# Patient Record
Sex: Female | Born: 1960 | Race: Black or African American | Hispanic: No | Marital: Married | Smoking: Former smoker
Health system: Southern US, Community
[De-identification: ages and names within clinical notes are randomized; demographics above are authoritative.]

## PROBLEM LIST (undated history)

## (undated) DIAGNOSIS — N62 Hypertrophy of breast: Principal | ICD-10-CM

## (undated) DIAGNOSIS — I10 Essential (primary) hypertension: Secondary | ICD-10-CM

## (undated) DIAGNOSIS — E039 Hypothyroidism, unspecified: Secondary | ICD-10-CM

## (undated) DIAGNOSIS — F419 Anxiety disorder, unspecified: Secondary | ICD-10-CM

## (undated) DIAGNOSIS — E079 Disorder of thyroid, unspecified: Secondary | ICD-10-CM

## (undated) DIAGNOSIS — M1990 Unspecified osteoarthritis, unspecified site: Secondary | ICD-10-CM

## (undated) DIAGNOSIS — I209 Angina pectoris, unspecified: Secondary | ICD-10-CM

## (undated) DIAGNOSIS — F32A Depression, unspecified: Secondary | ICD-10-CM

## (undated) HISTORY — PX: APPENDECTOMY: SHX54

## (undated) HISTORY — PX: REDUCTION MAMMAPLASTY: SUR839

## (undated) HISTORY — DX: Unspecified osteoarthritis, unspecified site: M19.90

## (undated) HISTORY — DX: Essential (primary) hypertension: I10

## (undated) HISTORY — DX: Disorder of thyroid, unspecified: E07.9

## (undated) HISTORY — PX: ABDOMINAL HYSTERECTOMY: SHX81

## (undated) HISTORY — DX: Hypertrophy of breast: N62

## (undated) HISTORY — PX: COLON SURGERY: SHX602

## (undated) HISTORY — DX: Anxiety disorder, unspecified: F41.9

## (undated) HISTORY — PX: MEDIAL PARTIAL KNEE REPLACEMENT: SHX5965

---

## 1998-07-08 ENCOUNTER — Other Ambulatory Visit: Admission: RE | Admit: 1998-07-08 | Discharge: 1998-07-08 | Payer: Self-pay | Admitting: *Deleted

## 1999-12-27 ENCOUNTER — Other Ambulatory Visit: Admission: RE | Admit: 1999-12-27 | Discharge: 1999-12-27 | Payer: Self-pay | Admitting: *Deleted

## 2005-10-25 ENCOUNTER — Ambulatory Visit: Payer: Self-pay | Admitting: Gynecology

## 2005-12-14 ENCOUNTER — Ambulatory Visit (HOSPITAL_COMMUNITY): Admission: RE | Admit: 2005-12-14 | Discharge: 2005-12-14 | Payer: Self-pay | Admitting: Gynecology

## 2005-12-14 ENCOUNTER — Ambulatory Visit: Payer: Self-pay | Admitting: Gynecology

## 2005-12-14 ENCOUNTER — Encounter (INDEPENDENT_AMBULATORY_CARE_PROVIDER_SITE_OTHER): Payer: Self-pay | Admitting: *Deleted

## 2006-01-03 ENCOUNTER — Ambulatory Visit: Payer: Self-pay | Admitting: Gynecology

## 2006-06-28 DIAGNOSIS — I10 Essential (primary) hypertension: Secondary | ICD-10-CM | POA: Insufficient documentation

## 2006-10-10 ENCOUNTER — Encounter: Admission: RE | Admit: 2006-10-10 | Discharge: 2006-10-10 | Payer: Self-pay | Admitting: Orthopaedic Surgery

## 2007-04-20 DIAGNOSIS — E890 Postprocedural hypothyroidism: Secondary | ICD-10-CM | POA: Insufficient documentation

## 2007-07-14 ENCOUNTER — Ambulatory Visit: Payer: Self-pay | Admitting: Family Medicine

## 2007-07-14 DIAGNOSIS — F411 Generalized anxiety disorder: Secondary | ICD-10-CM | POA: Insufficient documentation

## 2007-07-14 DIAGNOSIS — M25569 Pain in unspecified knee: Secondary | ICD-10-CM | POA: Insufficient documentation

## 2008-07-17 ENCOUNTER — Ambulatory Visit: Payer: Self-pay | Admitting: Unknown Physician Specialty

## 2008-10-18 ENCOUNTER — Ambulatory Visit: Payer: Self-pay | Admitting: Unknown Physician Specialty

## 2009-06-09 HISTORY — PX: TUBAL LIGATION: SHX77

## 2009-07-21 ENCOUNTER — Ambulatory Visit: Payer: Self-pay | Admitting: Unknown Physician Specialty

## 2009-07-24 ENCOUNTER — Ambulatory Visit: Payer: Self-pay | Admitting: Unknown Physician Specialty

## 2010-03-23 ENCOUNTER — Ambulatory Visit: Payer: Self-pay | Admitting: Unknown Physician Specialty

## 2010-04-02 ENCOUNTER — Ambulatory Visit: Payer: Self-pay | Admitting: Unknown Physician Specialty

## 2010-04-14 LAB — PATHOLOGY REPORT

## 2010-05-05 ENCOUNTER — Ambulatory Visit: Payer: Self-pay | Admitting: Gynecologic Oncology

## 2010-05-07 ENCOUNTER — Ambulatory Visit: Payer: Self-pay | Admitting: Gynecologic Oncology

## 2010-06-07 ENCOUNTER — Ambulatory Visit: Payer: Self-pay | Admitting: Gynecologic Oncology

## 2010-07-07 ENCOUNTER — Ambulatory Visit: Payer: Self-pay | Admitting: Gynecologic Oncology

## 2010-07-24 NOTE — Op Note (Signed)
Kristina Peck, Kristina Peck                 ACCOUNT NO.:  [id number]   MEDICAL RECORD NO.:  [id number]          PATIENT TYPE:  AMB   LOCATION:  SDC                           FACILITY:  [facility]   PHYSICIAN:  Ginger Carne, MD  DATE OF BIRTH:  05-11-1960   DATE OF PROCEDURE:  12/14/2005  DATE OF DISCHARGE:  12/14/2005                                 OPERATIVE REPORT   PREOPERATIVE DIAGNOSIS:  Chronic right lower quadrant pain.   POSTOPERATIVE DIAGNOSIS:  Chronic right lower quadrant pain, chronic  appendicitis and right adnexal adhesions.   PROCEDURE:  Laparoscopic right salpingo-oophorectomy and laparoscopic  appendectomy.   SURGEON:  Ginger Carne, MD   ASSISTANT:  None.   COMPLICATIONS:  None immediate.   ESTIMATED BLOOD LOSS:  Minimal.   SPECIMEN:  Right tube and ovary and appendix to pathology.   ANESTHESIA:  General.   OPERATIVE FINDINGS:  The patient had a previous vaginal hysterectomy with  removal of uterus and cervix.  The left ovary and tube were encased in  rectosigmoid adhesive disease but the patient had no complaints regarding  the left lower quadrant and these were left intact.  The right adnexa was  adherent to the distal bowel and its respective sidewall.  The right ovary  was normal size.  The ureter carefully identified in the right pelvic region  near the takeoff of the infundibulopelvic ligament into the true pelvis.  The appendix demonstrated markedly thickened mesoappendix consistent with  chronic appendicitis.  Large and small bowel grossly normal.  No evidence of  femoral, inguinal or obturator hernias.  Upper abdomen including liver were  normal.   OPERATIVE PROCEDURE:  The patient prepped and draped in the usual fashion  and placed in the lithotomy position.  Betadine solution used for antiseptic  and the patient was catheterized prior to procedure.  After adequate general  anesthesia, vertical infraumbilical incision was made.  The Veress needle  placed in the abdomen.  Opening closing pressures were 10 to 15 mmHg.  Needle released, trocar placed in same incision.  Laparoscope placed in the  trocar sleeve.  Afterwards under direct visualization two 5 mm ports were  made in the left lower quadrant, left hypogastric regions.  The mesoappendix  was identified, bipolar cauterized and cut to the base.  2-0 Vicryl sutures  then placed at the base and then another about 8 mm above the first two  loops.  The appendix was then cut above the first two loops at the base and  removed with an Endopouch bag without difficulty.  Copious irrigation at the  base with lactated Ringer's followed.  No active bleeding noted.  Afterwards, attention was directed to the right adnexa.  Identification of  the ureter in the region of the right tube and ovary and the  infundibulopelvic ligament was pursued.  Afterwards the right  infundibulopelvic ligament was bipolar cauterized and cut, the right tube  and ovary removed with a Endopouch bag without difficulty.  No active  bleeding noted at the stump site.  Copious irrigation with  lactated Ringer's followed.  Afterwards gas released, irrigant removed.  Closure of the 10 mm fascia site with 0 Vicryl suture and 4-0 Vicryl for  subcuticular closure.  The instrument and sponge count were correct.  The  patient tolerated procedure well, returned to the post anesthesia recovery  room in excellent condition.      Ginger Carne, MD  Electronically Signed     SHB/MEDQ  D:  12/14/2005  T:  12/15/2005  Job:  [id number]

## 2010-08-07 ENCOUNTER — Ambulatory Visit: Payer: Self-pay | Admitting: Gynecologic Oncology

## 2010-08-18 ENCOUNTER — Ambulatory Visit: Payer: Self-pay | Admitting: Gynecologic Oncology

## 2010-09-06 ENCOUNTER — Ambulatory Visit: Payer: Self-pay | Admitting: Gynecologic Oncology

## 2010-10-07 ENCOUNTER — Ambulatory Visit: Payer: Self-pay | Admitting: Gynecologic Oncology

## 2010-11-07 ENCOUNTER — Ambulatory Visit: Payer: Self-pay | Admitting: Gynecologic Oncology

## 2011-01-13 ENCOUNTER — Ambulatory Visit: Payer: Self-pay | Admitting: Gynecologic Oncology

## 2011-01-19 ENCOUNTER — Ambulatory Visit: Payer: Self-pay | Admitting: Gynecologic Oncology

## 2011-02-06 ENCOUNTER — Ambulatory Visit: Payer: Self-pay | Admitting: Gynecologic Oncology

## 2011-09-06 DIAGNOSIS — R1900 Intra-abdominal and pelvic swelling, mass and lump, unspecified site: Secondary | ICD-10-CM | POA: Insufficient documentation

## 2011-09-06 DIAGNOSIS — N898 Other specified noninflammatory disorders of vagina: Secondary | ICD-10-CM | POA: Insufficient documentation

## 2011-09-06 HISTORY — DX: Intra-abdominal and pelvic swelling, mass and lump, unspecified site: R19.00

## 2012-05-23 ENCOUNTER — Ambulatory Visit: Payer: Self-pay | Admitting: Family Medicine

## 2012-06-06 ENCOUNTER — Ambulatory Visit: Payer: Self-pay | Admitting: Family Medicine

## 2012-06-29 ENCOUNTER — Ambulatory Visit: Payer: Self-pay | Admitting: Unknown Physician Specialty

## 2012-08-18 DIAGNOSIS — G8929 Other chronic pain: Secondary | ICD-10-CM | POA: Insufficient documentation

## 2012-08-18 DIAGNOSIS — M25561 Pain in right knee: Secondary | ICD-10-CM | POA: Insufficient documentation

## 2013-04-09 ENCOUNTER — Ambulatory Visit: Payer: Self-pay | Admitting: Family Medicine

## 2014-02-04 ENCOUNTER — Ambulatory Visit: Payer: Self-pay | Admitting: Gastroenterology

## 2014-03-05 DIAGNOSIS — Z6841 Body Mass Index (BMI) 40.0 and over, adult: Secondary | ICD-10-CM | POA: Insufficient documentation

## 2014-05-13 HISTORY — PX: KNEE ARTHROSCOPY W/ MENISCAL REPAIR: SHX1877

## 2014-09-04 ENCOUNTER — Encounter: Payer: Self-pay | Admitting: Family Medicine

## 2014-09-04 ENCOUNTER — Ambulatory Visit (INDEPENDENT_AMBULATORY_CARE_PROVIDER_SITE_OTHER): Payer: 59 | Admitting: Family Medicine

## 2014-09-04 VITALS — BP 128/88 | HR 103 | Temp 98.4°F | Resp 18 | Ht 67.0 in | Wt 291.5 lb

## 2014-09-04 DIAGNOSIS — J40 Bronchitis, not specified as acute or chronic: Secondary | ICD-10-CM | POA: Diagnosis not present

## 2014-09-04 DIAGNOSIS — G43009 Migraine without aura, not intractable, without status migrainosus: Secondary | ICD-10-CM | POA: Insufficient documentation

## 2014-09-04 DIAGNOSIS — J209 Acute bronchitis, unspecified: Secondary | ICD-10-CM

## 2014-09-04 MED ORDER — BENZONATATE 200 MG PO CAPS: 200.0000 mg | ORAL_CAPSULE | Freq: Three times a day (TID) | ORAL | Status: DC | PRN

## 2014-09-04 MED ORDER — PROMETHAZINE-CODEINE 6.25-10 MG/5ML PO SYRP: 5.0000 mL | ORAL_SOLUTION | Freq: Three times a day (TID) | ORAL | Status: DC | PRN

## 2014-09-04 MED ORDER — AZITHROMYCIN 500 MG PO TABS: 500.0000 mg | ORAL_TABLET | Freq: Every day | ORAL | Status: DC

## 2014-09-04 NOTE — Progress Notes (Signed)
Name: Kristina Peck   MRN: [id number]    DOB: 1960-10-24   Date:09/04/2014       Progress Note  Subjective  Chief Complaint  Chief Complaint  Patient presents with  . URI    started sunday with a chest cold. patient has taking several otc meds, cough drops and lemon tea.    HPI  Patient is here today with concerns regarding the following symptoms congestion, post nasal drip, ear pressure, sinus pressure, productive cough, achiness and low grade fevers that started days ago.  Associated with fatigue and malaise. Not associated with headaches, rash, nausea vomiting, change in bowel movements. Has no positive sick contacts Has tried the following home remedies: hot teas, left over cough syrup.    Past Medical History  Diagnosis Date  . Anxiety   . Arthritis   . Hypertension   . Thyroid disease    Past Surgical History  Procedure Laterality Date  . Abdominal hysterectomy    . Tubal ligation Bilateral 06/09/2009    Bilateral salphingo-oophorectomy  . Appendectomy    . Colon surgery    . Knee arthroscopy w/ meniscal repair Right 05/13/2014     History  Substance Use Topics  . Smoking status: Never Smoker   . Smokeless tobacco: Not on file  . Alcohol Use: 0.0 oz/week    0 Standard drinks or equivalent per week     Comment: moderately     Current outpatient prescriptions:  .  ARMOUR THYROID 180 MG tablet, Take 1 tablet by mouth daily., Disp: , Rfl:  .  hydrochlorothiazide (HYDRODIURIL) 25 MG tablet, Take 1 tablet by mouth daily., Disp: , Rfl:  .  LORazepam (ATIVAN) 0.5 MG tablet, Take 1 tablet by mouth 2 (two) times daily as needed., Disp: , Rfl:  .  losartan (COZAAR) 100 MG tablet, Take 1 tablet by mouth daily., Disp: , Rfl:  .  sertraline (ZOLOFT) 50 MG tablet, Take 1 tablet by mouth daily., Disp: , Rfl:   Allergies  Allergen Reactions  . Lisinopril Hives    ROS  10 Systems reviewed and is negative except as mentioned in HPI.   Objective  Filed Vitals:   09/04/14  0849  BP: 128/88  Pulse: 103  Temp: 98.4 F (36.9 C)  TempSrc: Oral  Resp: 18  Height: 5\' 7"  (1.702 m)  Weight: 291 lb 8 oz (132.224 kg)  SpO2: 93%   Body mass index is 45.64 kg/(m^2).   Physical Exam  Constitutional: Patient is obese and well-nourished. In no acute distress but does appear to be fatigued from acute illness. HEENT:  - Head: Normocephalic and atraumatic.  - Ears: RIGHT TM bulging with minimal clear exudate, LEFT TM bulging with minimal clear exudate.  - Nose: Nasal mucosa boggy and congested.  - Mouth/Throat: Oropharynx is moist with slight erythema of bilateral tonsils without hypertrophy or exudates. Post nasal drainage present.  - Eyes: Conjunctivae clear, EOM movements normal. PERRLA. No scleral icterus.  Neck: Normal range of motion. Neck supple. No JVD present. No thyromegaly present. No local lymphadenopathy. Cardiovascular: Regular rate, regular rhythm with no murmurs heard.  Pulmonary/Chest: Effort normal and breath sounds clear in all lung fields with some end expiratory rhonchi at anterior upper lung fields bilaterally.  Musculoskeletal: Normal range of motion bilateral UE and LE, no joint effusions. Skin: Skin is warm and dry. No rash noted. Psychiatric: Patient has a normal mood and affect. Behavior is normal in office today. Judgment and thought content normal in  office today.   Assessment & Plan  Etiologies include allergic rhinitis, viral or bacterial infection. Instructed patient on increasing hydration, nasal saline spray, steam inhalation, NSAID if tolerated and not contraindicated. If not already doing so start taking daily anti-histamine and use a steroid nasal spray. If symptoms persist/worsen may consider antibiotic therapy.

## 2014-09-04 NOTE — Patient Instructions (Signed)

## 2014-10-07 ENCOUNTER — Other Ambulatory Visit: Payer: Self-pay | Admitting: Family Medicine

## 2014-10-07 ENCOUNTER — Other Ambulatory Visit: Payer: Self-pay

## 2014-10-07 DIAGNOSIS — F32A Depression, unspecified: Secondary | ICD-10-CM

## 2014-10-07 DIAGNOSIS — I10 Essential (primary) hypertension: Secondary | ICD-10-CM

## 2014-10-07 DIAGNOSIS — F329 Major depressive disorder, single episode, unspecified: Secondary | ICD-10-CM

## 2014-10-07 DIAGNOSIS — F419 Anxiety disorder, unspecified: Principal | ICD-10-CM

## 2014-10-07 MED ORDER — LORAZEPAM 0.5 MG PO TABS: 0.5000 mg | ORAL_TABLET | Freq: Two times a day (BID) | ORAL | Status: DC

## 2014-10-07 NOTE — Telephone Encounter (Signed)
Refill request was sent to Dr. Ashany Sundaram for approval and submission.  

## 2014-12-23 ENCOUNTER — Encounter: Payer: Self-pay | Admitting: Family Medicine

## 2014-12-23 ENCOUNTER — Ambulatory Visit (INDEPENDENT_AMBULATORY_CARE_PROVIDER_SITE_OTHER): Payer: 59 | Admitting: Family Medicine

## 2014-12-23 VITALS — BP 128/76 | HR 109 | Temp 98.5°F | Resp 18 | Wt 286.7 lb

## 2014-12-23 DIAGNOSIS — F411 Generalized anxiety disorder: Secondary | ICD-10-CM | POA: Diagnosis not present

## 2014-12-23 DIAGNOSIS — G8929 Other chronic pain: Secondary | ICD-10-CM

## 2014-12-23 DIAGNOSIS — M25561 Pain in right knee: Secondary | ICD-10-CM

## 2014-12-23 MED ORDER — LORAZEPAM 0.5 MG PO TABS: 0.5000 mg | ORAL_TABLET | Freq: Two times a day (BID) | ORAL | Status: DC

## 2014-12-23 NOTE — Progress Notes (Signed)
Name: Kristina Peck   MRN: [id number]    DOB: 14-Apr-1960   Date:12/23/2014       Progress Note  Subjective  Chief Complaint  Chief Complaint  Patient presents with  . Hypertension    patient is here for a follow-up and to have her appeals form completed.  Marland Kitchen Anxiety    medicaiton management/ adjustment    HPI  Kristina Peck is a 54 year old female here today to have [facility] form filled out as she did not meet their BMI goals. She struggles with weight for many years and reports despite dietary changes she is inconsistent. Exercise activity is limited by chronic arthritis in her right knee. She is working with Orthopedic specialist, could not tolerate Celebrex, using NSAIDs and had intraarticular injections which are not helping much. Trying water aerobics and got a puppy so she can walk more.  Otherwise reports anxiety symptoms are reasonably well controlled. Mostly she gets worked up and worried due to work and often goes home with a lot on her mind. Previously tried increasing Zoloft to 100 mg but it made her too somnolent so she is back to 50 mg a day.  Past Medical History  Diagnosis Date  . Anxiety   . Arthritis   . Hypertension   . Thyroid disease     Patient Active Problem List   Diagnosis Date Noted  . Migraine without aura and without status migrainosus, not intractable 09/04/2014  . Obesity, Class III, BMI 40-49.9 (morbid obesity) ([place]) 03/05/2014  . Chronic pain of right knee 08/18/2012  . Anxiety, generalized 07/14/2007  . Adult hypothyroidism 04/20/2007  . Hypertension goal BP (blood pressure) < 140/90 06/28/2006    Social History  Substance Use Topics  . Smoking status: Never Smoker   . Smokeless tobacco: Not on file  . Alcohol Use: 0.0 oz/week    0 Standard drinks or equivalent per week     Comment: moderately     Current outpatient prescriptions:  .  ARMOUR THYROID 180 MG tablet, Take 1 tablet by mouth daily., Disp: , Rfl:  .  hydrochlorothiazide  (HYDRODIURIL) 25 MG tablet, Take 1 tablet by mouth  daily, Disp: 90 tablet, Rfl: 2 .  LORazepam (ATIVAN) 0.5 MG tablet, Take 1 tablet (0.5 mg total) by mouth 2 (two) times daily., Disp: 180 tablet, Rfl: 1 .  losartan (COZAAR) 100 MG tablet, Take 1 tablet by mouth daily., Disp: , Rfl:  .  sertraline (ZOLOFT) 50 MG tablet, Take 1 tablet by mouth daily., Disp: , Rfl:   Past Surgical History  Procedure Laterality Date  . Abdominal hysterectomy    . Tubal ligation Bilateral 06/09/2009    Bilateral salphingo-oophorectomy  . Appendectomy    . Colon surgery    . Knee arthroscopy w/ meniscal repair Right 05/13/2014    Family History  Problem Relation Age of Onset  . Diabetes Mother   . Hypertension Mother     Allergies  Allergen Reactions  . Lisinopril Hives     Review of Systems  CONSTITUTIONAL: No significant weight changes, fever, chills, weakness or fatigue.  SKIN: No rash or itching.  CARDIOVASCULAR: No chest pain, chest pressure or chest discomfort. No palpitations or edema.  RESPIRATORY: No shortness of breath, cough or sputum.  NEUROLOGICAL: No headache, dizziness, syncope, paralysis, ataxia, numbness or tingling in the extremities. No memory changes. No change in bowel or bladder control.  MUSCULOSKELETAL: No joint pain. No muscle pain. HEMATOLOGIC: No anemia, bleeding or bruising.  LYMPHATICS: No enlarged lymph nodes.  PSYCHIATRIC: No change in mood. No change in sleep pattern.  ENDOCRINOLOGIC: No reports of sweating, cold or heat intolerance. No polyuria or polydipsia.     Objective  BP 128/76 mmHg  Pulse 109  Temp(Src) 98.5 F (36.9 C) (Oral)  Resp 18  Wt 286 lb 11.2 oz (130.046 kg)  SpO2 97% Body mass index is 44.89 kg/(m^2).  Physical Exam  Constitutional: Patient is obese and well-nourished. In no distress.  Cardiovascular: Normal rate, regular rhythm and normal heart sounds.  No murmur heard.  Pulmonary/Chest: Effort normal and breath sounds normal. No  respiratory distress. Musculoskeletal: Normal range of motion bilateral UE and LE, no joint effusions. Peripheral vascular: Bilateral LE no edema. Neurological: CN II-XII grossly intact with no focal deficits. Alert and oriented to person, place, and time. Coordination, balance, strength, speech and gait are normal.  Skin: Skin is warm and dry. No rash noted. No erythema.  Psychiatric: Patient has a normal mood and affect. Behavior is normal in office today. Judgment and thought content normal in office today.   Assessment & Plan  1. GAD (generalized anxiety disorder) Instructed patient to try 1.5 tablets of Zoloft 50 mg dose and call me in 1 week.  - LORazepam (ATIVAN) 0.5 MG tablet; Take 1 tablet (0.5 mg total) by mouth 2 (two) times daily.  Dispense: 180 tablet; Refill: 1  2. Chronic pain of right knee Follow up with Orthopedist.  3. Obesity, Class III, BMI 40-49.9 (morbid obesity) ([place]) [facility] Form filled out. She has lost about 5 lbs since her last visit.   The patient has been counseled on their higher than normal BMI.  They have verbally expressed understanding their increased risk for other diseases.  In efforts to meet a better target BMI goal the patient has been counseled on lifestyle, diet and exercise modification tactics. Start with moderate intensity aerobic exercise (walking, jogging, elliptical, swimming, group or individual sports, hiking) at least 46mins a day at least 4 days a week and increase intensity, duration, frequency as tolerated. Diet should include well balance fresh fruits and vegetables avoiding processed foods, carbohydrates and sugars. Drink at least 8oz 10 glasses a day avoiding sodas, sugary fruit drinks, sweetened tea. Check weight on a reliable scale daily and monitor weight loss progress daily. Consider investing in mobile phone apps that will help keep track of weight loss goals.

## 2015-02-24 ENCOUNTER — Other Ambulatory Visit: Payer: Self-pay | Admitting: Family Medicine

## 2015-03-18 ENCOUNTER — Other Ambulatory Visit: Payer: Self-pay

## 2015-03-18 DIAGNOSIS — F411 Generalized anxiety disorder: Secondary | ICD-10-CM

## 2015-03-21 ENCOUNTER — Other Ambulatory Visit: Payer: Self-pay

## 2015-03-21 DIAGNOSIS — F411 Generalized anxiety disorder: Secondary | ICD-10-CM

## 2015-03-25 MED ORDER — LORAZEPAM 0.5 MG PO TABS: 0.5000 mg | ORAL_TABLET | Freq: Two times a day (BID) | ORAL | Status: DC

## 2015-05-06 ENCOUNTER — Other Ambulatory Visit: Payer: Self-pay | Admitting: Family Medicine

## 2015-06-05 ENCOUNTER — Ambulatory Visit: Payer: 59 | Admitting: Family Medicine

## 2015-07-10 HISTORY — PX: KNEE ARTHROSCOPY: SUR90

## 2015-07-28 ENCOUNTER — Other Ambulatory Visit: Payer: Self-pay

## 2015-07-28 DIAGNOSIS — Z5181 Encounter for therapeutic drug level monitoring: Secondary | ICD-10-CM

## 2015-07-28 DIAGNOSIS — E038 Other specified hypothyroidism: Secondary | ICD-10-CM

## 2015-07-29 DIAGNOSIS — Z5181 Encounter for therapeutic drug level monitoring: Secondary | ICD-10-CM | POA: Insufficient documentation

## 2015-07-29 NOTE — Assessment & Plan Note (Signed)
>>  ASSESSMENT AND PLAN FOR ADULT HYPOTHYROIDISM WRITTEN ON 07/29/2015  4:37 PM BY LADA, Satira Anis, MD  Needs labs; last TSH Jan 2016

## 2015-07-29 NOTE — Telephone Encounter (Signed)
Last TSH was actually Jan 2016; please ask patient to have labs done tomorrow or Wednesday She should not run out of medicine until we get the labs back if she'll go tomorrow or the next day Need to make sure dose doesn't need to be adjusted Thank you

## 2015-07-29 NOTE — Assessment & Plan Note (Signed)
Needs labs; last TSH Jan 2016

## 2015-07-30 MED ORDER — ARMOUR THYROID 180 MG PO TABS: ORAL_TABLET | ORAL | Status: DC

## 2015-07-30 NOTE — Telephone Encounter (Signed)
I approved one month, but please urge her to go just as soon as she can; don't wait a month; please do this week if possible; too much thyroid medicine can lead to osteoporosis, fractures, dangerous heart arrhythmias, and heart failure so this really needs to be monitored; I won't be able to continue the 180 mg strength beyond that without labs; thank you; we're just trying to be safe and take good care of her

## 2015-07-30 NOTE — Telephone Encounter (Signed)
Pt states just had  Knee replacement and can not get around good.  She wants to know if you can refill for 1 month until she can go get bloodwork?

## 2015-09-22 ENCOUNTER — Other Ambulatory Visit: Payer: Self-pay | Admitting: Family Medicine

## 2015-09-22 ENCOUNTER — Encounter: Payer: Self-pay | Admitting: Family Medicine

## 2015-09-23 LAB — BASIC METABOLIC PANEL
BUN/Creatinine Ratio: 12 (ref 9–23)
BUN: 10 mg/dL (ref 6–24)
CALCIUM: 9.3 mg/dL (ref 8.7–10.2)
CHLORIDE: 96 mmol/L (ref 96–106)
CO2: 27 mmol/L (ref 18–29)
CREATININE: 0.86 mg/dL (ref 0.57–1.00)
GFR calc Af Amer: 89 mL/min/{1.73_m2} (ref >59)
GFR calc non Af Amer: 77 mL/min/{1.73_m2} (ref >59)
GLUCOSE: 94 mg/dL (ref 65–99)
Potassium: 4 mmol/L (ref 3.5–5.2)
Sodium: 139 mmol/L (ref 134–144)

## 2015-09-23 LAB — T4, FREE: FREE T4: 0.79 ng/dL — AB (ref 0.82–1.77)

## 2015-09-23 LAB — T3, FREE: T3 FREE: 3.7 pg/mL (ref 2.0–4.4)

## 2015-09-23 LAB — TSH: TSH: 14.45 u[IU]/mL — AB (ref 0.450–4.500)

## 2015-09-24 ENCOUNTER — Telehealth: Payer: Self-pay | Admitting: Family Medicine

## 2015-09-24 NOTE — Telephone Encounter (Signed)
Abnormal thyroid tests; need to adjust medicine

## 2015-09-26 ENCOUNTER — Ambulatory Visit (INDEPENDENT_AMBULATORY_CARE_PROVIDER_SITE_OTHER): Payer: 59 | Admitting: Family Medicine

## 2015-09-26 ENCOUNTER — Encounter: Payer: Self-pay | Admitting: Family Medicine

## 2015-09-26 VITALS — BP 132/86 | HR 98 | Temp 99.4°F | Resp 14 | Wt 256.0 lb

## 2015-09-26 DIAGNOSIS — Z1231 Encounter for screening mammogram for malignant neoplasm of breast: Secondary | ICD-10-CM

## 2015-09-26 DIAGNOSIS — Z114 Encounter for screening for human immunodeficiency virus [HIV]: Secondary | ICD-10-CM

## 2015-09-26 DIAGNOSIS — G4700 Insomnia, unspecified: Secondary | ICD-10-CM | POA: Diagnosis not present

## 2015-09-26 DIAGNOSIS — Z1159 Encounter for screening for other viral diseases: Secondary | ICD-10-CM

## 2015-09-26 DIAGNOSIS — F411 Generalized anxiety disorder: Secondary | ICD-10-CM

## 2015-09-26 DIAGNOSIS — I10 Essential (primary) hypertension: Secondary | ICD-10-CM | POA: Diagnosis not present

## 2015-09-26 DIAGNOSIS — E038 Other specified hypothyroidism: Secondary | ICD-10-CM

## 2015-09-26 HISTORY — DX: Encounter for screening mammogram for malignant neoplasm of breast: Z12.31

## 2015-09-26 MED ORDER — TRAZODONE HCL 50 MG PO TABS
25.0000 mg | ORAL_TABLET | Freq: Every evening | ORAL | Status: DC | PRN
Start: 2015-09-26 — End: 2015-10-31

## 2015-09-26 MED ORDER — ESCITALOPRAM OXALATE 10 MG PO TABS: 10.0000 mg | ORAL_TABLET | Freq: Every day | ORAL | Status: DC

## 2015-09-26 MED ORDER — ARMOUR THYROID 180 MG PO TABS: ORAL_TABLET | ORAL | Status: DC

## 2015-09-26 MED ORDER — LOSARTAN POTASSIUM 100 MG PO TABS: 100.0000 mg | ORAL_TABLET | Freq: Every day | ORAL | Status: DC

## 2015-09-26 MED ORDER — HYDROCHLOROTHIAZIDE 25 MG PO TABS: 25.0000 mg | ORAL_TABLET | Freq: Every day | ORAL | Status: DC

## 2015-09-26 NOTE — Assessment & Plan Note (Signed)
Had goiter, then RAIU, was hyper, and now hypo; refer back to endo for stabilization

## 2015-09-26 NOTE — Patient Instructions (Addendum)
Please do call to schedule your mammogram; the number to schedule one at either [facility] or [facility] is [phone]  Stop the sertraline Start the escitalopram Use the trazodone at bedtime if needed for sleep  Have labs done today or soon If you have not heard anything from my staff in a week about any orders/referrals/studies from today, please contact us here to follow-up [phone]  We'll refer you to the endocrinologist  Check out the information at familydoctor.org entitled "Nutrition for Weight Loss: What You Need to Know about Fad Diets" Try to lose between 1-2 pounds per week by taking in fewer calories and burning off more calories You can succeed by limiting portions, limiting foods dense in calories and fat, becoming more active, and drinking 8 glasses of water a day (64 ounces) Don't skip meals, especially breakfast, as skipping meals may alter your metabolism Do not use over-the-counter weight loss pills or gimmicks that claim rapid weight loss A healthy BMI (or body mass index) is between 18.5 and 24.9 You can calculate your ideal BMI at the Industry website ClubMonetize.fr  Your goal blood pressure is less than 140 mmHg on top. Try to follow the DASH guidelines (DASH stands for Dietary Approaches to Stop Hypertension) Try to limit the sodium in your diet.  Ideally, consume less than 1.5 grams (less than 1,500mg ) per day. Do not add salt when cooking or at the table.  Check the sodium amount on labels when shopping, and choose items lower in sodium when given a choice. Avoid or limit foods that already contain a lot of sodium. Eat a diet rich in fruits and vegetables and whole grains.  DASH Eating Plan DASH stands for "Dietary Approaches to Stop Hypertension." The DASH eating plan is a healthy eating plan that has been shown to reduce high blood pressure (hypertension). Additional health  benefits may include reducing the risk of type 2 diabetes mellitus, heart disease, and stroke. The DASH eating plan may also help with weight loss. WHAT DO I NEED TO KNOW ABOUT THE DASH EATING PLAN? For the DASH eating plan, you will follow these general guidelines:  Choose foods with a percent daily value for sodium of less than 5% (as listed on the food label).  Use salt-free seasonings or herbs instead of table salt or sea salt.  Check with your health care provider or pharmacist before using salt substitutes.  Eat lower-sodium products, often labeled as "lower sodium" or "no salt added."  Eat fresh foods.  Eat more vegetables, fruits, and low-fat dairy products.  Choose whole grains. Look for the word "whole" as the first word in the ingredient list.  Choose fish and skinless chicken or [place] more often than red meat. Limit fish, poultry, and meat to 6 oz (170 g) each day.  Limit sweets, desserts, sugars, and sugary drinks.  Choose heart-healthy fats.  Limit cheese to 1 oz (28 g) per day.  Eat more home-cooked food and less restaurant, buffet, and fast food.  Limit fried foods.  Cook foods using methods other than frying.  Limit canned vegetables. If you do use them, rinse them well to decrease the sodium.  When eating at a restaurant, ask that your food be prepared with less salt, or no salt if possible. WHAT FOODS CAN I EAT? Seek help from a dietitian for individual calorie needs. Grains Whole grain or whole wheat bread. Brown rice. Whole grain or whole wheat pasta. Quinoa, bulgur, and whole grain  cereals. Low-sodium cereals. Corn or whole wheat flour tortillas. Whole grain cornbread. Whole grain crackers. Low-sodium crackers. Vegetables Fresh or frozen vegetables (raw, steamed, roasted, or grilled). Low-sodium or reduced-sodium tomato and vegetable juices. Low-sodium or reduced-sodium tomato sauce and paste. Low-sodium or reduced-sodium canned vegetables.  Fruits All  fresh, canned (in natural juice), or frozen fruits. Meat and Other Protein Products Ground beef (85% or leaner), grass-fed beef, or beef trimmed of fat. Skinless chicken or [place]. Ground chicken or [place]. Pork trimmed of fat. All fish and seafood. Eggs. Dried beans, peas, or lentils. Unsalted nuts and seeds. Unsalted canned beans. Dairy Low-fat dairy products, such as skim or 1% milk, 2% or reduced-fat cheeses, low-fat ricotta or cottage cheese, or plain low-fat yogurt. Low-sodium or reduced-sodium cheeses. Fats and Oils Tub margarines without trans fats. Light or reduced-fat mayonnaise and salad dressings (reduced sodium). Avocado. Safflower, olive, or canola oils. Natural peanut or almond butter. Other Unsalted popcorn and pretzels. The items listed above may not be a complete list of recommended foods or beverages. Contact your dietitian for more options. WHAT FOODS ARE NOT RECOMMENDED? Grains White bread. White pasta. White rice. Refined cornbread. Bagels and croissants. Crackers that contain trans fat. Vegetables Creamed or fried vegetables. Vegetables in a cheese sauce. Regular canned vegetables. Regular canned tomato sauce and paste. Regular tomato and vegetable juices. Fruits Dried fruits. Canned fruit in light or heavy syrup. Fruit juice. Meat and Other Protein Products Fatty cuts of meat. Ribs, chicken wings, bacon, sausage, bologna, salami, chitterlings, fatback, hot dogs, bratwurst, and packaged luncheon meats. Salted nuts and seeds. Canned beans with salt. Dairy Whole or 2% milk, cream, half-and-half, and cream cheese. Whole-fat or sweetened yogurt. Full-fat cheeses or blue cheese. Nondairy creamers and whipped toppings. Processed cheese, cheese spreads, or cheese curds. Condiments Onion and garlic salt, seasoned salt, table salt, and sea salt. Canned and packaged gravies. Worcestershire sauce. Tartar sauce. Barbecue sauce. Teriyaki sauce. Soy sauce, including reduced sodium.  Steak sauce. Fish sauce. Oyster sauce. Cocktail sauce. Horseradish. Ketchup and mustard. Meat flavorings and tenderizers. Bouillon cubes. Hot sauce. Tabasco sauce. Marinades. Taco seasonings. Relishes. Fats and Oils Butter, stick margarine, lard, shortening, ghee, and bacon fat. Coconut, palm kernel, or palm oils. Regular salad dressings. Other Pickles and olives. Salted popcorn and pretzels. The items listed above may not be a complete list of foods and beverages to avoid. Contact your dietitian for more information. WHERE CAN I FIND MORE INFORMATION? National Heart, Lung, and Blood Institute: travelstabloid.com   This information is not intended to replace advice given to you by your health care provider. Make sure you discuss any questions you have with your health care provider.   Document Released: 02/11/2011 Document Revised: 03/15/2014 Document Reviewed: 12/27/2012 Elsevier Interactive Patient Education [employer].

## 2015-09-26 NOTE — Progress Notes (Signed)
BP 132/86   Pulse 98   Temp 99.4 F (37.4 C) (Oral)   Resp 14   Wt 256 lb (116.1 kg)   SpO2 94%   BMI 40.10 kg/m     Subjective:    Patient ID: Kristina Peck, female    DOB: April 08, 1960, 55 y.o.   MRN: [id number]  HPI: Kristina Peck is a 55 y.o. female  Chief Complaint  Patient presents with  . Follow-up  . Anxiety   She got her weight down to get the surgery; she lost 35 pounds over the last year; she went to a weight loss clinic, 6 week diet plan, started in February; she cut out dairy, no oils, no sugars, organic sugar, just eating eggs in the morning and 40 calorie bread slice; salads are "free", certain dressings; chicken for lunch; veggies with every meal; did that for 6 weeks and then went into maintenance; never went back to sugar or soft drinks; drinks unsweetened tea;  She works for [employer] and has to meet their BMI; now that she's had her knee replacement, she can be more active  She is having more anxiety; just started back to work this week; works on [employer], academic and she enjoys it but went back to work; causes her anxiety, lots of stress; she felt like she could just break out crying and trying to hold it together; she takes lorazepam, takes it only at night; not during the day;   She has hypothyroidism; has not been taking medicine faithfully; misses 1-2 days a week on average; she does not get palpitations or chest pain; never could take synthroid, never regulated things, gained weight; does not want to take it; she used to go to Dr. Ronnald Collum, then once she got regulated  Depression screen [facility] 2/9 09/26/2015 12/23/2014 09/04/2014  Decreased Interest 0 0 0  Down, Depressed, Hopeless 1 0 0  PHQ - 2 Score 1 0 0   Relevant past medical, surgical, family and social history reviewed Past Medical History:  Diagnosis Date  . Anxiety   . Arthritis   . Hypertension   . Thyroid disease    Past Surgical History:  Procedure Laterality Date  . ABDOMINAL HYSTERECTOMY    .  APPENDECTOMY    . COLON SURGERY    . KNEE ARTHROSCOPY Right 07/10/2015  . KNEE ARTHROSCOPY W/ MENISCAL REPAIR Right 05/13/2014  . MEDIAL PARTIAL KNEE REPLACEMENT Right [id number]  . TUBAL LIGATION Bilateral 06/09/2009   Bilateral salphingo-oophorectomy   Family History  Problem Relation Age of Onset  . Diabetes Mother   . Hypertension Mother    Social History  Substance Use Topics  . Smoking status: Former Research scientist (life sciences)  . Smokeless tobacco: Not on file  . Alcohol use 0.0 oz/week     Comment: moderately   Interim medical history since last visit reviewed. Allergies and medications reviewed  Review of Systems Per HPI unless specifically indicated above     Objective:    BP 132/86   Pulse 98   Temp 99.4 F (37.4 C) (Oral)   Resp 14   Wt 256 lb (116.1 kg)   SpO2 94%   BMI 40.10 kg/m    Wt Readings from Last 3 Encounters:  10/31/15 253 lb 12.8 oz (115.1 kg)  09/26/15 256 lb (116.1 kg)  12/23/14 286 lb 11.2 oz (130 kg)    Physical Exam  Constitutional: She appears well-developed and well-nourished. No distress.  HENT:  Head: Normocephalic and atraumatic.  Eyes: EOM  are normal. No scleral icterus.  Neck: No thyromegaly present.  Cardiovascular: Normal rate, regular rhythm and normal heart sounds.   Pulmonary/Chest: Effort normal and breath sounds normal.  Abdominal: Soft. She exhibits no distension.  Musculoskeletal: She exhibits no edema.  Neurological: She is alert.  Skin: Skin is warm and dry. No pallor.  Psychiatric: She has a normal mood and affect. Her behavior is normal. Judgment and thought content normal.      Assessment & Plan:   Problem List Items Addressed This Visit      Cardiovascular and Mediastinum   Hypertension goal BP (blood pressure) < 140/90    Fair control; weight loss, DASH guidelines; meds      Relevant Medications   losartan (COZAAR) 100 MG tablet   hydrochlorothiazide (HYDRODIURIL) 25 MG tablet     Endocrine   Adult hypothyroidism -  Primary    Had goiter, then RAIU, was hyper, and now hypo; refer back to endo for stabilization      Relevant Medications   ARMOUR THYROID 180 MG tablet   Other Relevant Orders   Ambulatory referral to Endocrinology     Other   Screening for HIV (human immunodeficiency virus)   Relevant Orders   HIV antibody   Need for hepatitis C screening test   Relevant Orders   Hepatitis C Antibody   Insomnia    Will use trazodone for sleep if needed; also encouraged non-pharm means for sleep, sleep hygiene encouraged      Encounter for screening mammogram for breast cancer   Relevant Orders   MM DIGITAL SCREENING BILATERAL   Anxiety, generalized    Change SSRIs; relaxation, dealing with stress with non-pharm means important too       Other Visit Diagnoses    Hypertension, goal below 140/90       Relevant Medications   losartan (COZAAR) 100 MG tablet   hydrochlorothiazide (HYDRODIURIL) 25 MG tablet      Follow up plan: Return in about 4 weeks (around 10/24/2015) for medication follow-up.  An after-visit summary was printed and given to the patient at [place].  Please see the patient instructions which may contain other information and recommendations beyond what is mentioned above in the assessment and plan.  Meds ordered this encounter  Medications  . DISCONTD: escitalopram (LEXAPRO) 10 MG tablet    Sig: Take 1 tablet (10 mg total) by mouth daily.    Dispense:  30 tablet    Refill:  0  . DISCONTD: traZODone (DESYREL) 50 MG tablet    Sig: Take 0.5-1 tablets (25-50 mg total) by mouth at bedtime as needed for sleep.    Dispense:  30 tablet    Refill:  3  . ARMOUR THYROID 180 MG tablet    Sig: Take 1 tablet by mouth every morning    Dispense:  90 tablet    Refill:  0  . losartan (COZAAR) 100 MG tablet    Sig: Take 1 tablet (100 mg total) by mouth daily.    Dispense:  90 tablet    Refill:  1  . hydrochlorothiazide (HYDRODIURIL) 25 MG tablet    Sig: Take 1 tablet (25 mg  total) by mouth daily.    Dispense:  90 tablet    Refill:  3   Orders Placed This Encounter  Procedures  . MM DIGITAL SCREENING BILATERAL  . HIV antibody  . Hepatitis C Antibody  . Ambulatory referral to Endocrinology

## 2015-09-26 NOTE — Telephone Encounter (Signed)
I saw patient today; she admits to poor compliance with her medicine; she'll see Dr. Ronnald Collum to get this regulated

## 2015-09-26 NOTE — Assessment & Plan Note (Signed)
>>  ASSESSMENT AND PLAN FOR ADULT HYPOTHYROIDISM WRITTEN ON 09/26/2015  4:35 PM BY LADA, MELINDA P, MD  Had goiter, then RAIU, was hyper, and now hypo; refer back to endo for stabilization

## 2015-10-31 ENCOUNTER — Encounter: Payer: Self-pay | Admitting: Family Medicine

## 2015-10-31 ENCOUNTER — Ambulatory Visit (INDEPENDENT_AMBULATORY_CARE_PROVIDER_SITE_OTHER): Payer: 59 | Admitting: Family Medicine

## 2015-10-31 DIAGNOSIS — E038 Other specified hypothyroidism: Secondary | ICD-10-CM | POA: Diagnosis not present

## 2015-10-31 DIAGNOSIS — F411 Generalized anxiety disorder: Secondary | ICD-10-CM | POA: Diagnosis not present

## 2015-10-31 DIAGNOSIS — Z1159 Encounter for screening for other viral diseases: Secondary | ICD-10-CM

## 2015-10-31 DIAGNOSIS — G4700 Insomnia, unspecified: Secondary | ICD-10-CM | POA: Diagnosis not present

## 2015-10-31 DIAGNOSIS — Z114 Encounter for screening for human immunodeficiency virus [HIV]: Secondary | ICD-10-CM | POA: Diagnosis not present

## 2015-10-31 MED ORDER — SUVOREXANT 5 MG PO TABS: 5.0000 mg | ORAL_TABLET | Freq: Every evening | ORAL | 2 refills | Status: DC | PRN

## 2015-10-31 MED ORDER — ESCITALOPRAM OXALATE 10 MG PO TABS: 15.0000 mg | ORAL_TABLET | Freq: Every day | ORAL | 1 refills | Status: DC

## 2015-10-31 NOTE — Assessment & Plan Note (Signed)
Discussed one-time HIV screening recommendation per USPSTF guidelines; patient agrees with testing; HIV antibody ordered 

## 2015-10-31 NOTE — Patient Instructions (Addendum)
Increase escitalopram to 15 mg daily Practice mindfulness Try the Belsomra for sleep if needed  12 Ways to Curb Anxiety  ?Anxiety is normal human sensation. It is what helped our ancestors survive the pitfalls of the wilderness. Anxiety is defined as experiencing worry or nervousness about an imminent event or something with an uncertain outcome. It is a feeling experienced by most people at some point in their lives. Anxiety can be triggered by a very personal issue, such as the illness of a loved one, or an event of global proportions, such as a refugee crisis. Some of the symptoms of anxiety are:  Feeling restless.  Having a feeling of impending danger.  Increased heart rate.  Rapid breathing. Sweating.  Shaking.  Weakness or feeling tired.  Difficulty concentrating on anything except the current worry.  Insomnia.  Stomach or bowel problems. What can we do about anxiety we may be feeling? There are many techniques to help manage stress and relax. Here are 12 ways you can reduce your anxiety almost immediately: 1. Turn off the constant feed of information. Take a social media sabbatical. Studies have shown that social media directly contributes to social anxiety.  2. Monitor your television viewing habits. Are you watching shows that are also contributing to your anxiety, such as 24-hour news stations? Try watching something else, or better yet, nothing at all. Instead, listen to music, read an inspirational book or practice a hobby. 3. Eat nutritious meals. Also, don't skip meals and keep healthful snacks on hand. Hunger and poor diet contributes to feeling anxious. 4. Sleep. Sleeping on a regular schedule for at least seven to eight hours a night will do wonders for your outlook when you are awake. 5. Exercise. Regular exercise will help rid your body of that anxious energy and help you get more restful sleep. 6. Try deep (diaphragmatic) breathing. Inhale slowly through your nose for five  seconds and exhale through your mouth. 7. Practice acceptance and gratitude. When anxiety hits, accept that there are things out of your control that shouldn't be of immediate concern.  8. Seek out humor. When anxiety strikes, watch a funny video, read jokes or call a friend who makes you laugh. Laughter is healing for our bodies and releases endorphins that are calming. 9. Stay positive. Take the effort to replace negative thoughts with positive ones. Try to see a stressful situation in a positive light. Try to come up with solutions rather than dwelling on the problem. 10. Figure out what triggers your anxiety. Keep a journal and make note of anxious moments and the events surrounding them. This will help you identify triggers you can avoid or even eliminate. 11. Talk to someone. Let a trusted friend, family member or even trained professional know that you are feeling overwhelmed and anxious. Verbalize what you are feeling and why.  12. Volunteer. If your anxiety is triggered by a crisis on a large scale, become an advocate and work to resolve the problem that is causing you unease. Anxiety is often unwelcome and can become overwhelming. If not kept in check, it can become a disorder that could require medical treatment. However, if you take the time to care for yourself and avoid the triggers that make you anxious, you will be able to find moments of relaxation and clarity that make your life much more enjoyable.   Steps to Elicit the Relaxation Response The following is the technique reprinted with permission from Dr. Billie Ruddy book The Relaxation Response  pages 162-163 1. Sit quietly in a comfortable position. 2. Close your eyes. 3. Deeply relax all your muscles,  beginning at your feet and progressing up to your face.  Keep them relaxed. 4. Breathe through your nose.  Become aware of your breathing.  As you breathe out, say the word, "one"*,  silently to yourself. For example,   breathe in ... out, "one",- in .. out, "one", etc.  Breathe easily and naturally. 5. Continue for 10 to 20 minutes.  You may open your eyes to check the time, but do not use an alarm.  When you finish, sit quietly for several minutes,  at first with your eyes closed and later with your eyes opened.  Do not stand up for a few minutes. 6. Do not worry about whether you are successful  in achieving a deep level of relaxation.  Maintain a passive attitude and permit relaxation to occur at its own pace.  When distracting thoughts occur,  try to ignore them by not dwelling upon them  and return to repeating "one."  With practice, the response should come with little effort.  Practice the technique once or twice daily,  but not within two hours after any meal,  since the digestive processes seem to interfere with  the elicitation of the Relaxation Response. * It is better to use a soothing, mellifluous sound, preferably with no meaning. or association, to avoid stimulation of unnecessary thoughts - a mantra.

## 2015-10-31 NOTE — Progress Notes (Signed)
BP 128/66   Pulse 95   Temp 98.3 F (36.8 C) (Oral)   Resp 14   Wt 253 lb 12.8 oz (115.1 kg)   SpO2 97%   BMI 39.75 kg/m    Subjective:    Patient ID: Kristina Peck, female    DOB: September 30, 1960, 55 y.o.   MRN: [id number]  HPI: Kristina Peck is a 55 y.o. female  Chief Complaint  Patient presents with  . Follow-up    4 weeks   Patient is here for follow-up Her medicine was switched; her anxiety medicine was switched and the medicine that helps her sleep; she just doesn't think it's doing what the other medicine did (she used to be on ativan) She is going to get her mammogram She wants labs and HIV and Hep C for screening She is under a lot of stress at work right now, trying to get things implemented The medicine for sleep is now trazodone; she goes to sleep but doesn't always stay asleep and as weird dreams She used to be able to completely rest with the lorazepam; wants to try to not think about work Going to get massage tomorrow, going shopping all planned out to have a relaxing weekend She struggles with her weight has truly been trying to limit calories, eat better, be active and is not seeing any results; she is ready to try medicine at this time; she has been keeping up with her steps, and many days has 8-11k steps  Relevant past medical, surgical, family and social history reviewed Past Medical History:  Diagnosis Date  . Anxiety   . Arthritis   . Hypertension   . Thyroid disease    Past Surgical History:  Procedure Laterality Date  . ABDOMINAL HYSTERECTOMY    . APPENDECTOMY    . COLON SURGERY    . KNEE ARTHROSCOPY Right 07/10/2015  . KNEE ARTHROSCOPY W/ MENISCAL REPAIR Right 05/13/2014  . MEDIAL PARTIAL KNEE REPLACEMENT Right [id number]  . TUBAL LIGATION Bilateral 06/09/2009   Bilateral salphingo-oophorectomy   Family History  Problem Relation Age of Onset  . Diabetes Mother   . Hypertension Mother    Social History  Substance Use Topics  . Smoking status:  Former Research scientist (life sciences)  . Smokeless tobacco: Not on file  . Alcohol use 0.0 oz/week     Comment: moderately   Interim medical history since last visit reviewed. Allergies and medications reviewed  Review of Systems Per HPI unless specifically indicated above     Objective:    BP 128/66   Pulse 95   Temp 98.3 F (36.8 C) (Oral)   Resp 14   Wt 253 lb 12.8 oz (115.1 kg)   SpO2 97%   BMI 39.75 kg/m   Wt Readings from Last 3 Encounters:  10/31/15 253 lb 12.8 oz (115.1 kg)  09/26/15 256 lb (116.1 kg)  12/23/14 286 lb 11.2 oz (130 kg)    Physical Exam  Constitutional: She appears well-developed and well-nourished. No distress.  HENT:  Head: Normocephalic and atraumatic.  Eyes: EOM are normal. No scleral icterus.  Neck: No thyromegaly present.  Cardiovascular: Normal rate, regular rhythm and normal heart sounds.   No murmur heard. Pulmonary/Chest: Effort normal and breath sounds normal.  Abdominal: She exhibits no distension.  Musculoskeletal: She exhibits no edema.  Neurological: She is alert.  Skin: Skin is warm and dry. She is not diaphoretic. No pallor.  Psychiatric: She has a normal mood and affect. Her behavior is normal.  Judgment and thought content normal.       Assessment & Plan:   Problem List Items Addressed This Visit      Endocrine   Adult hypothyroidism    May be compounding her obesity; close f/u with TSH recheck; reviewed July's TSH        Other   Screening for HIV (human immunodeficiency virus)    Discussed one-time HIV screening recommendation per USPSTF guidelines; patient agrees with testing; HIV antibody ordered       Relevant Orders   HIV antibody (with reflex)   Obesity, Class III, BMI 40-49.9 (morbid obesity) ([place])    Patient has been trying to implement healthy weight loss strategies including activity, healthy eating, drinking water; she is agreeable to starting medicine; we opted to start with Belviq; return for close f/u; if any side effects,  stop and contact me      Need for hepatitis C screening test    Discussed one-time hep C screening recommendation for individuals born between [phone] per USPSTF guidelines; patient agrees with testing; Hep C Ab ordered      Relevant Orders   Hepatitis c antibody (reflex) (Completed)    Other Visit Diagnoses   None.      Follow up plan: Return in about 3 months (around 01/31/2016) for follow-up; call me in 3-4 weeks.  An after-visit summary was printed and given to the patient at [place].  Please see the patient instructions which may contain other information and recommendations beyond what is mentioned above in the assessment and plan.  Meds ordered this encounter  Medications  . escitalopram (LEXAPRO) 10 MG tablet    Sig: Take 1.5 tablets (15 mg total) by mouth daily.    Dispense:  45 tablet    Refill:  1    Pharmacy -- we're increasing the dose  . Suvorexant (BELSOMRA) 5 MG TABS    Sig: Take 5 mg by mouth at bedtime as needed.    Dispense:  30 tablet    Refill:  2    Orders Placed This Encounter  Procedures  . HIV antibody (with reflex)  . Hepatitis c antibody (reflex)  . HIV antibody  . HCV Comment:

## 2015-10-31 NOTE — Assessment & Plan Note (Signed)
Discussed one-time hep C screening recommendation for individuals born between 1945-1965 per USPSTF guidelines; patient agrees with testing; Hep C Ab ordered 

## 2015-11-01 LAB — HIV ANTIBODY (ROUTINE TESTING W REFLEX): HIV SCREEN 4TH GENERATION: NONREACTIVE

## 2015-11-01 LAB — HEPATITIS C ANTIBODY (REFLEX)

## 2015-11-01 LAB — HCV COMMENT:

## 2015-11-02 DIAGNOSIS — G4700 Insomnia, unspecified: Secondary | ICD-10-CM | POA: Insufficient documentation

## 2015-11-02 NOTE — Assessment & Plan Note (Addendum)
May be compounding her obesity; she's take medicine regularly now; f/u with TSH recheck; reviewed July's TSH

## 2015-11-02 NOTE — Assessment & Plan Note (Addendum)
Patient has been trying to implement healthy weight loss strategies including activity, healthy eating, drinking water; thyroid issue may be compounding this problem

## 2015-11-02 NOTE — Assessment & Plan Note (Signed)
Nothing is ever going to be as effective likely as the benzo she used to take; I explained that it's hard to compete with that once someone has been on a benzo; we discussed risks of that at previous visit; I spent time describing the Relaxation Response, and mindfulness, breathing, relaxation; she would like to also increase the SSRI; call with any problems

## 2015-11-02 NOTE — Assessment & Plan Note (Signed)
As with anxiety, it's hard to compete with a benzo; she had been on a benzo prescribed by her previous provider; I do not want her to use a benzo long-term; she is really wanting to have something to take for sleep; remeron would not be a good solution b/c of weight gain; she has tried trazodone with poor results; will start belsomra; return in 3 months for f/u

## 2015-11-02 NOTE — Assessment & Plan Note (Signed)
>>  ASSESSMENT AND PLAN FOR ADULT HYPOTHYROIDISM WRITTEN ON 11/02/2015  6:06 PM BY LADA, MELINDA P, MD  May be compounding her obesity; she's take medicine regularly now; f/u with TSH recheck; reviewed July's TSH

## 2015-11-02 NOTE — Assessment & Plan Note (Signed)
>>  ASSESSMENT AND PLAN FOR MORBID OBESITY ([place]) WRITTEN ON 11/02/2015  6:06 PM BY LADA, MELINDA P, MD  Patient has been trying to implement healthy weight loss strategies including activity, healthy eating, drinking water; thyroid issue may be compounding this problem

## 2015-11-14 ENCOUNTER — Other Ambulatory Visit: Payer: Self-pay | Admitting: Family Medicine

## 2015-11-14 NOTE — Telephone Encounter (Signed)
Patient should be seeing Dr. Ronnald Collum for her thyroid now; referral was entered in July Please let pt know we'd like her to get the thyroid medicine from him from now on Thank you

## 2015-11-14 NOTE — Telephone Encounter (Signed)
Left detailed voicemail

## 2015-11-26 NOTE — Assessment & Plan Note (Addendum)
Will use trazodone for sleep if needed; also encouraged non-pharm means for sleep, sleep hygiene encouraged

## 2015-11-26 NOTE — Assessment & Plan Note (Signed)
Change SSRIs; relaxation, dealing with stress with non-pharm means important too

## 2015-11-26 NOTE — Assessment & Plan Note (Signed)
Fair control; weight loss, DASH guidelines; meds

## 2015-12-09 ENCOUNTER — Ambulatory Visit: Payer: 59 | Admitting: Family Medicine

## 2016-01-27 ENCOUNTER — Telehealth: Payer: Self-pay | Admitting: Family Medicine

## 2016-01-27 NOTE — Telephone Encounter (Signed)
Pt is requesting Trazodone from [facility]. Not on med list. Called pt regarding the concern why asking for RX but not on the list. Left pt a voicemail asking to give [place] a call back regarding the concern.

## 2016-01-27 NOTE — Telephone Encounter (Signed)
Refill request for trazodone from [facility]; no dose listed, no instructions This isn't on her med list Please call patient and get more info Also please clarify if she is taking 15 mg of lexapro (escitalopram) or just 10 mg daily Thank you

## 2016-02-05 NOTE — Telephone Encounter (Signed)
Phone note open; patient has not called back; closing note

## 2016-02-11 ENCOUNTER — Telehealth: Payer: Self-pay

## 2016-02-11 DIAGNOSIS — F411 Generalized anxiety disorder: Secondary | ICD-10-CM

## 2016-02-11 MED ORDER — ESCITALOPRAM OXALATE 10 MG PO TABS: 15.0000 mg | ORAL_TABLET | Freq: Every day | ORAL | 2 refills | Status: DC

## 2016-02-11 MED ORDER — TRAZODONE HCL 50 MG PO TABS: 25.0000 mg | ORAL_TABLET | Freq: Every evening | ORAL | 1 refills | Status: DC | PRN

## 2016-02-11 MED ORDER — TRAZODONE HCL 50 MG PO TABS: 25.0000 mg | ORAL_TABLET | Freq: Every evening | ORAL | 5 refills | Status: DC | PRN

## 2016-02-11 MED ORDER — ESCITALOPRAM OXALATE 10 MG PO TABS: 15.0000 mg | ORAL_TABLET | Freq: Every day | ORAL | 0 refills | Status: DC

## 2016-02-11 NOTE — Telephone Encounter (Signed)
Thank you New Rxs sent to both local and mail order

## 2016-02-11 NOTE — Telephone Encounter (Signed)
If you go back to our notes from 01/27/2016, pt asked for a refill of trazodone. Wasn't on her list and you were concern, why she asking for it. Patient sated you put her on another type of medication for her sleep but its not working . She stated had the trazodone left and went back to that and its working. She would like to be put back on trazodone. Also you asked how many mg was she taken of her Lexapro; pt state one and a half a pill daily, therefore 15mg .

## 2016-02-25 ENCOUNTER — Encounter: Payer: Self-pay | Admitting: Family Medicine

## 2016-03-31 ENCOUNTER — Other Ambulatory Visit: Payer: Self-pay | Admitting: Family Medicine

## 2016-03-31 DIAGNOSIS — F411 Generalized anxiety disorder: Secondary | ICD-10-CM

## 2016-04-05 NOTE — Telephone Encounter (Signed)
Left voicemail regarding  patient request for Lexapro. Asked  Patient to give our office a call back.

## 2016-04-05 NOTE — Telephone Encounter (Signed)
Request received for escitalopram; however, she shouldn't be out; I sent a 3 month supply on 02/11/16 Also, I had hoped to see patient 3 months after last visit She canceled her appointment Please ask her to schedule an appt in the next few weeks (that will end up being a 6 month f/u) Thank you

## 2016-04-15 ENCOUNTER — Telehealth: Payer: Self-pay | Admitting: Family Medicine

## 2016-04-15 NOTE — Telephone Encounter (Signed)
Patient offered first open appt on wed 2/14.  She was told to check bp and if high or still having headache to go to urgent care or ER.  Patient told to call back about bp reading.

## 2016-04-15 NOTE — Telephone Encounter (Signed)
Pt would like to schedule appointment due to blood pressure issues since Monday. She has not taken any readings. Is having headaches as well. Dr Sanda Klein is completely booked please advise [phone] or [phone]

## 2016-04-26 ENCOUNTER — Telehealth: Payer: Self-pay | Admitting: Family Medicine

## 2016-04-26 DIAGNOSIS — E039 Hypothyroidism, unspecified: Secondary | ICD-10-CM

## 2016-04-26 NOTE — Assessment & Plan Note (Signed)
Check labs 

## 2016-04-26 NOTE — Telephone Encounter (Signed)
Pt is going to schedule appointment but would like for you to order labs for her thyroids. I informed patient that you do not like to order labs before appointment but she states that she works for [employer] and she does not mind different orders that she is needing to order this before coming in. Appt made for 05/03/16

## 2016-04-26 NOTE — Assessment & Plan Note (Signed)
>>  ASSESSMENT AND PLAN FOR ADULT HYPOTHYROIDISM WRITTEN ON 04/26/2016  6:15 PM BY LADA, MELINDA P, MD  Check labs

## 2016-04-26 NOTE — Telephone Encounter (Signed)
Orders for just thyroid tests entered We'll see her next week and can figure out if anything else needed then, but I'm just entering thyroid for now Thank you

## 2016-04-27 NOTE — Telephone Encounter (Signed)
Left detailed voicemail

## 2016-04-30 LAB — T3, FREE: T3 FREE: 3.6 pg/mL (ref 2.0–4.4)

## 2016-04-30 LAB — T4, FREE: FREE T4: 1.24 ng/dL (ref 0.82–1.77)

## 2016-04-30 LAB — TSH: TSH: 1.25 u[IU]/mL (ref 0.450–4.500)

## 2016-05-01 ENCOUNTER — Other Ambulatory Visit: Payer: Self-pay | Admitting: Family Medicine

## 2016-05-02 NOTE — Telephone Encounter (Signed)
Labs from July 2017 reviewed; Rx approved

## 2016-05-03 ENCOUNTER — Other Ambulatory Visit: Payer: Self-pay | Admitting: Family Medicine

## 2016-05-03 ENCOUNTER — Ambulatory Visit: Payer: 59 | Admitting: Family Medicine

## 2016-05-03 MED ORDER — LOSARTAN POTASSIUM 100 MG PO TABS: 100.0000 mg | ORAL_TABLET | Freq: Every day | ORAL | 0 refills | Status: DC

## 2016-05-03 NOTE — Telephone Encounter (Signed)
I don't understand about her canceling an appt because her labs were not back yet; they resulted at 0541 hours on 04/30/2016 (very early Friday morning) I sent a 90 day prescription for losartan yesterday (Sunday) to her mail order I'll send limited supply to local pharmacy of the losartan to last until mail order arrives

## 2016-05-03 NOTE — Telephone Encounter (Signed)
Had appointment for today but cancelled for Friday due to her lab results not being in. However, she is requesting that you give her a refill of her losartan medication and that you send to [facility] [address]

## 2016-05-07 ENCOUNTER — Ambulatory Visit (INDEPENDENT_AMBULATORY_CARE_PROVIDER_SITE_OTHER): Payer: 59 | Admitting: Family Medicine

## 2016-05-07 ENCOUNTER — Encounter: Payer: Self-pay | Admitting: Family Medicine

## 2016-05-07 VITALS — BP 120/78 | HR 90 | Temp 98.7°F | Resp 16 | Wt 251.2 lb

## 2016-05-07 DIAGNOSIS — E039 Hypothyroidism, unspecified: Secondary | ICD-10-CM

## 2016-05-07 DIAGNOSIS — G43009 Migraine without aura, not intractable, without status migrainosus: Secondary | ICD-10-CM

## 2016-05-07 DIAGNOSIS — I10 Essential (primary) hypertension: Secondary | ICD-10-CM | POA: Diagnosis not present

## 2016-05-07 DIAGNOSIS — Z23 Encounter for immunization: Secondary | ICD-10-CM

## 2016-05-07 DIAGNOSIS — G4700 Insomnia, unspecified: Secondary | ICD-10-CM

## 2016-05-07 NOTE — Patient Instructions (Addendum)
You received the flu shot today; it should protect you against the flu virus over the coming months; it will take about two weeks for antibodies to develop; do try to stay away from hospitals, nursing homes, and daycares during peak flu season; taking extra vitamin C daily during flu season may help you avoid getting sick Take vitamin D3 1000 iu daily for energy Ask your husband about breathing when you sleep Call me if any apnea You should be screened at age 54 years for an abdominal aneurysm, unless the guidelines change Check out the information at familydoctor.org entitled "Nutrition for Weight Loss: What You Need to Know about Fad Diets" Try to lose between 1-2 pounds per week by taking in fewer calories and burning off more calories You can succeed by limiting portions, limiting foods dense in calories and fat, becoming more active, and drinking 8 glasses of water a day (64 ounces) Don't skip meals, especially breakfast, as skipping meals may alter your metabolism Do not use over-the-counter weight loss pills or gimmicks that claim rapid weight loss A healthy BMI (or body mass index) is between 18.5 and 24.9 You can calculate your ideal BMI at the [place] website ClubMonetize.fr

## 2016-05-07 NOTE — Progress Notes (Signed)
BP 120/78   Pulse 90   Temp 98.7 F (37.1 C) (Oral)   Resp 16   Wt 251 lb 3.2 oz (113.9 kg)   SpO2 96%   BMI 39.34 kg/m    Subjective:    Patient ID: Kristina Peck, female    DOB: Oct 15, 1960, 56 y.o.   MRN: [id number]  HPI: Kristina Peck is a 56 y.o. female  Chief Complaint  Patient presents with  . 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; gets out for lunch break Sleeping better; woke up last night several times, laid there and fell back to sleep Up a few times through the night, but not as bad as it used to be; used to have trouble falling back to sleep Snores occasionally, just really tired No fam hx of sleep apnea, but husband has it Moving bowels okay Flu shot today Not diabetic, had full set of labs  Lab Results  Component Value Date   TSH 1.250 04/29/2016    Depression screen [facility] 2/9 05/07/2016 09/26/2015 12/23/2014 09/04/2014  Decreased Interest 0 0 0 0  Down, Depressed, Hopeless 0 1 0 0  PHQ - 2 Score 0 1 0 0   Relevant past medical, surgical, family and social history reviewed Past Medical History:  Diagnosis Date  . Anxiety   . Arthritis   . Hypertension   . Thyroid disease    Past Surgical History:  Procedure Laterality Date  . ABDOMINAL HYSTERECTOMY    . APPENDECTOMY    . COLON SURGERY    . KNEE ARTHROSCOPY Right 07/10/2015  . KNEE ARTHROSCOPY W/ MENISCAL REPAIR Right 05/13/2014  . MEDIAL PARTIAL KNEE REPLACEMENT Right [id number]  . TUBAL LIGATION Bilateral 06/09/2009   Bilateral salphingo-oophorectomy   Family History  Problem Relation Age of Onset  . Diabetes Mother   . Hypertension Mother   . Hypertension Father   . Aneurysm  Father     aortic  . Hypertension Sister   . Heart attack Maternal Grandmother   . Heart failure Maternal Grandfather   . Heart attack Paternal Grandmother   . Cancer Paternal Grandfather     not sure   Social History  Substance Use Topics  . Smoking status: Former Smoker    Packs/day: 0.50    Years: 20.00    Types: Cigarettes    Quit date: 03/08/2008  . Smokeless tobacco: Never Used  . Alcohol use 0.0 oz/week     Comment: moderately   Interim medical history since last visit reviewed. Allergies and medications reviewed  Review of Systems Per HPI unless specifically indicated above     Objective:    BP 120/78   Pulse 90   Temp 98.7 F (37.1 C) (Oral)   Resp 16   Wt 251 lb 3.2 oz (113.9 kg)   SpO2 96%   BMI 39.34 kg/m   Wt Readings from Last 3 Encounters:  05/07/16 251 lb 3.2 oz (113.9 kg)  10/31/15 253 lb 12.8 oz (115.1 kg)  09/26/15 256 lb (116.1 kg)    Physical Exam  Constitutional: She appears well-developed and well-nourished. No distress.  HENT:  Head: Normocephalic and atraumatic.  Eyes: EOM are normal. No scleral icterus.  Neck: No thyromegaly present.  Cardiovascular: Normal rate, regular rhythm and normal heart sounds.   No murmur heard. Pulmonary/Chest: Effort normal and breath sounds normal.  Abdominal: She exhibits no distension.  Musculoskeletal: She exhibits no edema.  Neurological: She is alert.  Skin: Skin is warm and dry. She is not diaphoretic. No pallor.  Psychiatric: She has a normal mood and affect. Her behavior is normal. Judgment and thought content normal.   Results for orders placed or performed in visit on 04/26/16  TSH  Result Value Ref Range   TSH 1.250 0.450 - 4.500 uIU/mL  T3, free  Result Value Ref Range   T3, Free 3.6 2.0 - 4.4 pg/mL  T4, free  Result Value Ref Range   Free T4 1.24 0.82 - 1.77 ng/dL      Assessment & Plan:   Problem List Items Addressed This Visit      Cardiovascular and Mediastinum   Migraine  without aura and without status migrainosus, not intractable    Forms completed for patient; see copies      Hypertension goal BP (blood pressure) < 140/90    Continue on current medicines, ARB and thiazide diuretic        Endocrine   Adult hypothyroidism - Primary    Reviewed most recent labs with her; continue dose; check thyroid labs yearly or sooner if symptomatic or significant weight loss        Other   Insomnia    May use trazodone; patient to talk with her husband and contact me if any sleep apnea or concern that she may have disordered breathing at night; will get sleep study if occuring       Other Visit Diagnoses    Needs flu shot       Relevant Orders   Flu Vaccine QUAD 36+ mos PF IM (Fluarix & Fluzone Quad PF) (Completed)       Follow up plan: Return in about 3 months (around 08/07/2016) for weight management.  An after-visit summary was printed and given to the patient at [place].  Please see the patient instructions which may contain other information and recommendations beyond what is mentioned above in the assessment and plan.  No orders of the defined types were placed in this encounter.   Orders Placed This Encounter  Procedures  . Flu Vaccine QUAD 36+ mos PF IM (Fluarix & Fluzone Quad PF)

## 2016-05-08 ENCOUNTER — Other Ambulatory Visit: Payer: Self-pay | Admitting: Family Medicine

## 2016-05-08 DIAGNOSIS — F411 Generalized anxiety disorder: Secondary | ICD-10-CM

## 2016-05-15 NOTE — Assessment & Plan Note (Signed)
>>  ASSESSMENT AND PLAN FOR ADULT HYPOTHYROIDISM WRITTEN ON 05/15/2016  7:09 PM BY LADA, MELINDA P, MD  Reviewed most recent labs with her; continue dose; check thyroid labs yearly or sooner if symptomatic or significant weight loss

## 2016-05-15 NOTE — Assessment & Plan Note (Signed)
Continue on current medicines, ARB and thiazide diuretic

## 2016-05-15 NOTE — Assessment & Plan Note (Signed)
Forms completed for patient; see copies

## 2016-05-15 NOTE — Assessment & Plan Note (Signed)
May use trazodone; patient to talk with her husband and contact me if any sleep apnea or concern that she may have disordered breathing at night; will get sleep study if occuring

## 2016-05-15 NOTE — Assessment & Plan Note (Signed)
Reviewed most recent labs with her; continue dose; check thyroid labs yearly or sooner if symptomatic or significant weight loss

## 2016-06-10 ENCOUNTER — Ambulatory Visit
Admission: RE | Admit: 2016-06-10 | Discharge: 2016-06-10 | Disposition: A | Discharge status: Home / Self Care | Payer: 59 | Source: Ambulatory Visit | Attending: Family Medicine | Admitting: Family Medicine

## 2016-06-10 DIAGNOSIS — Z1231 Encounter for screening mammogram for malignant neoplasm of breast: Secondary | ICD-10-CM | POA: Insufficient documentation

## 2016-06-11 LAB — HM MAMMOGRAPHY: HM Mammogram: NORMAL (ref 0–4)

## 2016-06-17 ENCOUNTER — Inpatient Hospital Stay
Admission: RE | Admit: 2016-06-17 | Discharge: 2016-06-17 | Disposition: A | Discharge status: Home / Self Care | Payer: Self-pay | Source: Ambulatory Visit | Attending: *Deleted | Admitting: *Deleted

## 2016-06-17 ENCOUNTER — Other Ambulatory Visit: Payer: Self-pay | Admitting: *Deleted

## 2016-06-17 DIAGNOSIS — Z9289 Personal history of other medical treatment: Secondary | ICD-10-CM

## 2016-07-27 ENCOUNTER — Other Ambulatory Visit: Payer: Self-pay | Admitting: Family Medicine

## 2016-08-09 ENCOUNTER — Ambulatory Visit: Payer: 59 | Admitting: Family Medicine

## 2016-08-13 ENCOUNTER — Ambulatory Visit (INDEPENDENT_AMBULATORY_CARE_PROVIDER_SITE_OTHER): Payer: 59 | Admitting: Family Medicine

## 2016-08-13 ENCOUNTER — Encounter: Payer: Self-pay | Admitting: Family Medicine

## 2016-08-13 DIAGNOSIS — N62 Hypertrophy of breast: Secondary | ICD-10-CM | POA: Diagnosis not present

## 2016-08-13 DIAGNOSIS — E669 Obesity, unspecified: Secondary | ICD-10-CM

## 2016-08-13 MED ORDER — LIRAGLUTIDE -WEIGHT MANAGEMENT 18 MG/3ML ~~LOC~~ SOPN: 0.6000 mg | PEN_INJECTOR | Freq: Every day | SUBCUTANEOUS | 0 refills | Status: DC

## 2016-08-13 MED ORDER — PEN NEEDLES 31G X 6 MM MISC: 3 refills | Status: DC

## 2016-08-13 NOTE — Progress Notes (Signed)
BP 138/76 (BP Location: Left Arm, Patient Position: Sitting, Cuff Size: Large)   Pulse (!) 109   Temp 98 F (36.7 C) (Oral)   Resp 18   Ht _0  (1.702 m)   Wt 252 lb 11.2 oz (114.6 kg)   SpO2 97%   BMI 39.58 kg/m    Subjective:    Patient ID: [name], female    DOB: 1960-06-21, 56 y.o.   MRN: [id number]  HPI: SUNITA DEMOND is a 56 y.o. female  Chief Complaint  Patient presents with  . Obesity    Talk about Insurance requirements on weight loss and options   HPI She has tried dieting before and has been maintaining She is afraid to take medicine She had partial knee replacement Went back to the ortho 3 weeks ago Still getting back to walking, bothers her just a little bit Been walking but not able to do as much as she wants She bought another home and they are moving; cleaning stuff out, wear and tear on her body She has tried Weight Watchers Had some success at that, just doesn't know... If you don't have a medical reason for not losing 10% of BMI for year to year; has never had to pay because she had the knee issue Lost 40 pounds last year Trying to drink enough water, could do better She can't come off of HCTZ because her legs will swell up Lydia sometimes; no sports drinks; sweetener like splenda in her coffee Occasionally eats burgers, just really does not like fast food usually Has met nutritonist; will get out her notes; she thinks she has what she said, will get that info out  Large breasts; back and shoulder pain; grooves in the shoulders Would be interested in breast reduction in the future Current bra size, 42 G used to be H cup  Depression screen [facility] 2/9 08/13/2016 05/07/2016 09/26/2015 12/23/2014 09/04/2014  Decreased Interest 0 0 0 0 0  Down, Depressed, Hopeless 0 0 1 0 0  PHQ - 2 Score 0 0 1 0 0   Relevant past medical, surgical, family and social history reviewed Past Medical History:  Diagnosis Date  . Anxiety   . Arthritis     . Breast hypertrophy in female 08/18/2016  . Hypertension   . Thyroid disease    Past Surgical History:  Procedure Laterality Date  . ABDOMINAL HYSTERECTOMY    . APPENDECTOMY    . COLON SURGERY    . KNEE ARTHROSCOPY Right 07/10/2015  . KNEE ARTHROSCOPY W/ MENISCAL REPAIR Right 05/13/2014  . MEDIAL PARTIAL KNEE REPLACEMENT Right [id number]  . TUBAL LIGATION Bilateral 06/09/2009   Bilateral salphingo-oophorectomy   Family History  Problem Relation Age of Onset  . Diabetes Mother   . Hypertension Mother   . Hypertension Father   . Aneurysm Father        aortic  . Hypertension Sister   . Heart attack Maternal Grandmother   . Heart failure Maternal Grandfather   . Heart attack Paternal Grandmother   . Cancer Paternal Grandfather        not sure   Social History   Social History  . Marital status: Married    Spouse name: N/A  . Number of children: 2  . Years of education: N/A   Occupational History  . Billing Supervisor     Social History Main Topics  . Smoking status: Former Smoker    Packs/day: 0.50  Years: 20.00    Types: Cigarettes    Start date: 03/08/1988    Quit date: 03/08/2008  . Smokeless tobacco: Never Used  . Alcohol use 0.0 oz/week     Comment: moderately  . Drug use: No  . Sexual activity: Yes    Partners: Male   Other Topics Concern  . Not on file   Social History Narrative  . No narrative on file    Interim medical history since last visit reviewed. Allergies and medications reviewed  Review of Systems Per HPI unless specifically indicated above     Objective:    BP 138/76 (BP Location: Left Arm, Patient Position: Sitting, Cuff Size: Large)   Pulse (!) 109   Temp 98 F (36.7 C) (Oral)   Resp 18   Ht _0  (1.702 m)   Wt 252 lb 11.2 oz (114.6 kg)   SpO2 97%   BMI 39.58 kg/m   Wt Readings from Last 3 Encounters:  08/13/16 252 lb 11.2 oz (114.6 kg)  05/07/16 251 lb 3.2 oz (113.9 kg)  10/31/15 253 lb 12.8 oz (115.1 kg)    Physical  Exam  Constitutional: She appears well-developed and well-nourished. No distress.  HENT:  Head: Normocephalic and atraumatic.  Eyes: EOM are normal. No scleral icterus.  Neck: No thyromegaly present.  Cardiovascular: Normal rate and regular rhythm.   Pulmonary/Chest: Effort normal.  Abdominal: She exhibits no distension.  Musculoskeletal: She exhibits no edema.  Neurological: She is alert.  Skin: Skin is warm and dry. She is not diaphoretic. No pallor.  Psychiatric: She has a normal mood and affect.    Results for orders placed or performed in visit on 06/11/16  HM MAMMOGRAPHY  Result Value Ref Range   HM Mammogram Self Reported Normal 0-4 Bi-Rad, Self Reported Normal      Assessment & Plan:   Problem List Items Addressed This Visit      Other   Obesity (BMI 35.0-39.9 without comorbidity)    BMI is now 39.58, very close to morbid obesity; I agree with weight loss management with medication; see AVS; discussed options; will prescribe Saxenda; f/u in 6 weeks after initiation; reasons to call / stop medicine reviewed; in addition to healthy eating, portion control, hydration, moving more, etc.      Relevant Medications   Liraglutide -Weight Management ([place]) 18 MG/3ML SOPN   Breast hypertrophy in female    Discussed condition; will focus our attention right now on weight loss; can refer to plastic surgeon for reduction mammoplasty in a few months if she desires          Follow up plan: Return in about 6 weeks (around 09/24/2016) for follow-up visit with Dr. Sanda Klein.  An after-visit summary was printed and given to the patient at [place].  Please see the patient instructions which may contain other information and recommendations beyond what is mentioned above in the assessment and plan.  Meds ordered this encounter  Medications  . DUEXIS 800-26.6 MG TABS    Sig: Take 1 tablet by mouth 3 (three) times daily.    Refill:  1  . Liraglutide -Weight Management (SAXENDA) 18  MG/3ML SOPN    Sig: Inject 0.6 mg into the skin daily. x 1 week, then 1.2 mg daily x 1 week, then 1.8 mg daily x 1 week, then 2.4 mg daily x 1 week, then 3 mg daily    Dispense:  3 mL    Refill:  0  . Insulin Pen  Needle (PEN NEEDLES) 31G X 6 MM MISC    Sig: For use with Saxenda    Dispense:  90 each    Refill:  3    No orders of the defined types were placed in this encounter. Face-to-face time with patient was more than 25 minutes, >50% time spent counseling and coordination of care

## 2016-08-13 NOTE — Patient Instructions (Signed)
Let's start [place] In addition to healthy eating, hydration Check out the information at familydoctor.org entitled "Nutrition for Weight Loss: What You Need to Know about Fad Diets" Try to lose between 1-2 pounds per week by taking in fewer calories and burning off more calories You can succeed by limiting portions, limiting foods dense in calories and fat, becoming more active, and drinking 8 glasses of water a day (64 ounces) Don't skip meals, especially breakfast, as skipping meals may alter your metabolism Do not use over-the-counter weight loss pills or gimmicks that claim rapid weight loss A healthy BMI (or body mass index) is between 18.5 and 24.9 You can calculate your ideal BMI at the [place] website ClubMonetize.fr

## 2016-08-18 ENCOUNTER — Encounter: Payer: Self-pay | Admitting: Family Medicine

## 2016-08-18 DIAGNOSIS — N62 Hypertrophy of breast: Secondary | ICD-10-CM

## 2016-08-18 HISTORY — DX: Hypertrophy of breast: N62

## 2016-08-18 NOTE — Assessment & Plan Note (Signed)
Discussed condition; will focus our attention right now on weight loss; can refer to plastic surgeon for reduction mammoplasty in a few months if she desires

## 2016-08-18 NOTE — Assessment & Plan Note (Signed)
BMI is now 39.58, very close to morbid obesity; I agree with weight loss management with medication; see AVS; discussed options; will prescribe Saxenda; f/u in 6 weeks after initiation; reasons to call / stop medicine reviewed; in addition to healthy eating, portion control, hydration, moving more, etc.

## 2016-08-18 NOTE — Assessment & Plan Note (Signed)
>>  ASSESSMENT AND PLAN FOR MORBID OBESITY ([place]) WRITTEN ON 08/18/2016  4:59 PM BY LADA, MELINDA P, MD  BMI is now 39.58, very close to morbid obesity; I agree with weight loss management with medication; see AVS; discussed options; will prescribe Saxenda; f/u in 6 weeks after initiation; reasons to call / stop medicine reviewed; in addition to healthy eating, portion control, hydration, moving more, etc.

## 2016-10-01 ENCOUNTER — Ambulatory Visit (INDEPENDENT_AMBULATORY_CARE_PROVIDER_SITE_OTHER): Payer: 59 | Admitting: Family Medicine

## 2016-10-01 ENCOUNTER — Encounter: Payer: Self-pay | Admitting: Family Medicine

## 2016-10-01 DIAGNOSIS — I10 Essential (primary) hypertension: Secondary | ICD-10-CM | POA: Diagnosis not present

## 2016-10-01 DIAGNOSIS — G43009 Migraine without aura, not intractable, without status migrainosus: Secondary | ICD-10-CM

## 2016-10-01 DIAGNOSIS — Z5181 Encounter for therapeutic drug level monitoring: Secondary | ICD-10-CM

## 2016-10-01 DIAGNOSIS — F411 Generalized anxiety disorder: Secondary | ICD-10-CM

## 2016-10-01 MED ORDER — FLUOXETINE HCL 40 MG PO CAPS: 40.0000 mg | ORAL_CAPSULE | Freq: Every day | ORAL | 3 refills | Status: DC

## 2016-10-01 NOTE — Assessment & Plan Note (Addendum)
Shot made her tired; offered other pills, such as Belviq; offered bariatric surgery referral but she's not there yet; recommended B12 sublingual; I will be willing to sign her appeal form since she is actively working on this and compliant

## 2016-10-01 NOTE — Patient Instructions (Addendum)
Try magnesium oxide 250 mg or 400 mg daily for migraine prevention Try the sublingual vitamin b12 daily Check out the information at familydoctor.org entitled "Nutrition for Weight Loss: What You Need to Know about Fad Diets" Try to lose between 1-2 pounds per week by taking in fewer calories and burning off more calories You can succeed by limiting portions, limiting foods dense in calories and fat, becoming more active, and drinking 8 glasses of water a day (64 ounces) Don't skip meals, especially breakfast, as skipping meals may alter your metabolism Do not use over-the-counter weight loss pills or gimmicks that claim rapid weight loss A healthy BMI (or body mass index) is between 18.5 and 24.9 You can calculate your ideal BMI at the [place] website ClubMonetize.fr Stop the escitalopram Start the fluoxetine Call me in 3-4 weeks with an update

## 2016-10-01 NOTE — Assessment & Plan Note (Signed)
Controlled, try to follow DASH guidelines 

## 2016-10-01 NOTE — Assessment & Plan Note (Signed)
Stop the current SSRI and start the new fluoxetine; call with update in 3-4 weeks

## 2016-10-01 NOTE — Assessment & Plan Note (Signed)
Recommend magnesium supplementation

## 2016-10-01 NOTE — Assessment & Plan Note (Signed)
>>  ASSESSMENT AND PLAN FOR MORBID OBESITY ([place]) WRITTEN ON 10/01/2016  8:23 AM BY LADA, MELINDA P, MD  Shot made her tired; offered other pills, such as Belviq; offered bariatric surgery referral but she's not there yet; recommended B12 sublingual; I will be willing to sign her appeal form since she is actively working on this and compliant

## 2016-10-01 NOTE — Progress Notes (Signed)
BP 122/80   Pulse 92   Temp 98.1 F (36.7 C) (Oral)   Resp 14   Wt 255 lb 9.6 oz (115.9 kg)   SpO2 96%   BMI 40.03 kg/m    Subjective:    Patient ID: Kristina Peck, female    DOB: 10/09/1960, 56 y.o.   MRN: [id number]  HPI: Kristina Peck is a 56 y.o. female  Chief Complaint  Patient presents with  . Follow-up   HPI  She is here for f/u No medical excitement Just got back from a family reunion cruise  HTN; at goal today; checked once in a while, but pharmacy took theirs out; tries to avoid extra salt  Migraines; none since last episode; went to the urgent care; they gave her a shot; few months ago; had been going on for a whole week  Hypothyroidism; energy, bowels are pretty stable  Obesity; medicine made her very tired; did not do the shot while on the cruise; tried the contrave; has not tried belviq; going to take B12 and stay on the saxenda, titrate back up  [facility] does her annual labs and she'll send me a copy of that; she is working on weight  Depression screen [facility] 2/9 10/01/2016 08/13/2016 05/07/2016 09/26/2015 12/23/2014  Decreased Interest 0 0 0 0 0  Down, Depressed, Hopeless 0 0 0 1 0  PHQ - 2 Score 0 0 0 1 0  she is on the escitalopram but thinks it might not be working  Relevant past medical, surgical, family and social history reviewed Past Medical History:  Diagnosis Date  . Anxiety   . Arthritis   . Breast hypertrophy in female 08/18/2016  . Hypertension   . Thyroid disease    Past Surgical History:  Procedure Laterality Date  . ABDOMINAL HYSTERECTOMY    . APPENDECTOMY    . COLON SURGERY    . KNEE ARTHROSCOPY Right 07/10/2015  . KNEE ARTHROSCOPY W/ MENISCAL REPAIR Right 05/13/2014  . MEDIAL PARTIAL KNEE REPLACEMENT Right [id number]  . TUBAL LIGATION Bilateral 06/09/2009   Bilateral salphingo-oophorectomy   Family History  Problem Relation Age of Onset  . Diabetes Mother   . Hypertension Mother   . Hypertension Father   . Aneurysm Father    aortic  . Hypertension Sister   . Heart attack Maternal Grandmother   . Heart failure Maternal Grandfather   . Heart attack Paternal Grandmother   . Cancer Paternal Grandfather        not sure   Social History   Social History  . Marital status: Married    Spouse name: N/A  . Number of children: 2  . Years of education: N/A   Occupational History  . Billing Supervisor     Social History Main Topics  . Smoking status: Former Smoker    Packs/day: 0.50    Years: 20.00    Types: Cigarettes    Start date: 03/08/1988    Quit date: 03/08/2008  . Smokeless tobacco: Never Used  . Alcohol use 0.0 oz/week     Comment: moderately  . Drug use: No  . Sexual activity: Yes    Partners: Male   Other Topics Concern  . Not on file   Social History Narrative  . No narrative on file   Interim medical history since last visit reviewed. Allergies and medications reviewed  Review of Systems Per HPI unless specifically indicated above     Objective:    BP 122/80  Pulse 92   Temp 98.1 F (36.7 C) (Oral)   Resp 14   Wt 255 lb 9.6 oz (115.9 kg)   SpO2 96%   BMI 40.03 kg/m   Wt Readings from Last 3 Encounters:  10/01/16 255 lb 9.6 oz (115.9 kg)  08/13/16 252 lb 11.2 oz (114.6 kg)  05/07/16 251 lb 3.2 oz (113.9 kg)    Physical Exam  Constitutional: She appears well-developed and well-nourished. No distress.  HENT:  Head: Normocephalic and atraumatic.  Eyes: EOM are normal. No scleral icterus.  Neck: No thyromegaly present.  Cardiovascular: Normal rate and regular rhythm.   Pulmonary/Chest: Effort normal.  Abdominal: She exhibits no distension.  Musculoskeletal: She exhibits no edema.  Neurological: She is alert.  Skin: Skin is warm and dry. She is not diaphoretic. No pallor.  Psychiatric: She has a normal mood and affect.    Results for orders placed or performed in visit on 06/11/16  HM MAMMOGRAPHY  Result Value Ref Range   HM Mammogram Self Reported Normal 0-4 Bi-Rad,  Self Reported Normal      Assessment & Plan:   Problem List Items Addressed This Visit      Cardiovascular and Mediastinum   Migraine without aura and without status migrainosus, not intractable    Recommend magnesium supplementation      Relevant Medications   FLUoxetine (PROZAC) 40 MG capsule   Hypertension goal BP (blood pressure) < 140/90    Controlled, try to follow DASH guidelines        Other   Morbid obesity ([place])    Shot made her tired; offered other pills, such as Belviq; offered bariatric surgery referral but she's not there yet; recommended B12 sublingual; I will be willing to sign her appeal form since she is actively working on this and compliant      Medication monitoring encounter    She'll have wellness labs in a few weeks and have those sent to me      Anxiety, generalized    Stop the current SSRI and start the new fluoxetine; call with update in 3-4 weeks          Follow up plan: Return in about 3 months (around 01/01/2017) for follow-up visit with Dr. Sanda Klein.  An after-visit summary was printed and given to the patient at [place].  Please see the patient instructions which may contain other information and recommendations beyond what is mentioned above in the assessment and plan.  Meds ordered this encounter  Medications  . FLUoxetine (PROZAC) 40 MG capsule    Sig: Take 1 capsule (40 mg total) by mouth daily.    Dispense:  90 capsule    Refill:  3    STOP escitalopram    No orders of the defined types were placed in this encounter.

## 2016-10-01 NOTE — Assessment & Plan Note (Signed)
She'll have wellness labs in a few weeks and have those sent to me

## 2016-12-31 ENCOUNTER — Ambulatory Visit: Payer: 59 | Admitting: Family Medicine

## 2017-01-20 ENCOUNTER — Other Ambulatory Visit: Payer: Self-pay | Admitting: Family Medicine

## 2017-01-21 MED ORDER — TRAZODONE HCL 50 MG PO TABS: 25.0000 mg | ORAL_TABLET | Freq: Every evening | ORAL | 1 refills | Status: DC | PRN

## 2017-01-24 ENCOUNTER — Telehealth: Payer: Self-pay

## 2017-01-24 ENCOUNTER — Ambulatory Visit: Payer: 59 | Admitting: Family Medicine

## 2017-01-24 ENCOUNTER — Encounter: Payer: Self-pay | Admitting: Family Medicine

## 2017-01-24 VITALS — BP 140/82 | HR 93 | Temp 98.5°F | Ht 66.0 in | Wt 257.2 lb

## 2017-01-24 DIAGNOSIS — D2372 Other benign neoplasm of skin of left lower limb, including hip: Secondary | ICD-10-CM | POA: Diagnosis not present

## 2017-01-24 DIAGNOSIS — F411 Generalized anxiety disorder: Secondary | ICD-10-CM | POA: Diagnosis not present

## 2017-01-24 DIAGNOSIS — I10 Essential (primary) hypertension: Secondary | ICD-10-CM

## 2017-01-24 DIAGNOSIS — N899 Noninflammatory disorder of vagina, unspecified: Secondary | ICD-10-CM | POA: Diagnosis not present

## 2017-01-24 DIAGNOSIS — K6289 Other specified diseases of anus and rectum: Secondary | ICD-10-CM | POA: Insufficient documentation

## 2017-01-24 DIAGNOSIS — N898 Other specified noninflammatory disorders of vagina: Secondary | ICD-10-CM

## 2017-01-24 MED ORDER — LOSARTAN POTASSIUM 100 MG PO TABS: 100.0000 mg | ORAL_TABLET | Freq: Every day | ORAL | 1 refills | Status: DC

## 2017-01-24 MED ORDER — HYDROCHLOROTHIAZIDE 25 MG PO TABS: 25.0000 mg | ORAL_TABLET | Freq: Every day | ORAL | 1 refills | Status: DC

## 2017-01-24 NOTE — Assessment & Plan Note (Addendum)
Close to goal; work on weight loss, healthy eating

## 2017-01-24 NOTE — Patient Instructions (Addendum)
We'll get you back to [facility] I believe the place on your leg is a dermatofibroma; watch it and if it changes or grows or bleeds, then we'll refer you to a dermatologist Try to get 150 minutes of moderate intensity exercise weekly, build up gradually Try tylenol (per package directions), topicals (Aspercreme or other), temperature (heat or ice) Try turmeric as a natural anti-inflammatory (for pain and arthritis). It comes in capsules where you buy aspirin and fish oil, but also as a spice where you buy pepper and garlic powder. Do try to take your anxiety medicine daily  12 Ways to Curb Anxiety  ?Anxiety is normal human sensation. It is what helped our ancestors survive the pitfalls of the wilderness. Anxiety is defined as experiencing worry or nervousness about an imminent event or something with an uncertain outcome. It is a feeling experienced by most people at some point in their lives. Anxiety can be triggered by a very personal issue, such as the illness of a loved one, or an event of global proportions, such as a refugee crisis. Some of the symptoms of anxiety are:  Feeling restless.  Having a feeling of impending danger.  Increased heart rate.  Rapid breathing. Sweating.  Shaking.  Weakness or feeling tired.  Difficulty concentrating on anything except the current worry.  Insomnia.  Stomach or bowel problems. What can we do about anxiety we may be feeling? There are many techniques to help manage stress and relax. Here are 12 ways you can reduce your anxiety almost immediately: 1. Turn off the constant feed of information. Take a social media sabbatical. Studies have shown that social media directly contributes to social anxiety.  2. Monitor your television viewing habits. Are you watching shows that are also contributing to your anxiety, such as 24-hour news stations? Try watching something else, or better yet, nothing at all. Instead, listen to music, read an inspirational book or  practice a hobby. 3. Eat nutritious meals. Also, don't skip meals and keep healthful snacks on hand. Hunger and poor diet contributes to feeling anxious. 4. Sleep. Sleeping on a regular schedule for at least seven to eight hours a night will do wonders for your outlook when you are awake. 5. Exercise. Regular exercise will help rid your body of that anxious energy and help you get more restful sleep. 6. Try deep (diaphragmatic) breathing. Inhale slowly through your nose for five seconds and exhale through your mouth. 7. Practice acceptance and gratitude. When anxiety hits, accept that there are things out of your control that shouldn't be of immediate concern.  8. Seek out humor. When anxiety strikes, watch a funny video, read jokes or call a friend who makes you laugh. Laughter is healing for our bodies and releases endorphins that are calming. 9. Stay positive. Take the effort to replace negative thoughts with positive ones. Try to see a stressful situation in a positive light. Try to come up with solutions rather than dwelling on the problem. 10. Figure out what triggers your anxiety. Keep a journal and make note of anxious moments and the events surrounding them. This will help you identify triggers you can avoid or even eliminate. 11. Talk to someone. Let a trusted friend, family member or even trained professional know that you are feeling overwhelmed and anxious. Verbalize what you are feeling and why.  12. Volunteer. If your anxiety is triggered by a crisis on a large scale, become an advocate and work to resolve the problem that is causing  you unease. Anxiety is often unwelcome and can become overwhelming. If not kept in check, it can become a disorder that could require medical treatment. However, if you take the time to care for yourself and avoid the triggers that make you anxious, you will be able to find moments of relaxation and clarity that make your life much more enjoyable.

## 2017-01-24 NOTE — Telephone Encounter (Signed)
Patient was informed that she has been scheduled to see Bernie Covey, FNP-C on Monday, January 31, 2017 at 8am with [place] on [address], [place], [place] [address].

## 2017-01-24 NOTE — Progress Notes (Signed)
BP 140/82 (BP Location: Left Arm, Patient Position: Sitting, Cuff Size: Large)   Pulse 93   Temp 98.5 F (36.9 C) (Oral)   Ht 5' 6"  (1.676 m)   Wt 257 lb 3.2 oz (116.7 kg)   SpO2 94%   BMI 41.51 kg/m    Subjective:    Patient ID: [name], female    DOB: 1960/03/14, 56 y.o.   MRN: [id number]  HPI: DEASHA CLENDENIN is a 56 y.o. female  Chief Complaint  Patient presents with  . Follow-up    Needs PAP, refills on meds    HPI  She is here for f/u Obesity; quit taking the saxenda; wasn't really helping her weight; stopped it on her own; will try to exercise more, be more conscious of her weight; has really changed her eating habits; will try to balance her sweets; will pull away from desserts; walks 1.5 to 2 miles in the mornings; walks at work;   High blood pressure; on medicine; had eggplant last night, chicken, maybe some salt, [place], ordered; no decongestants; no black licorice  On anxiety medicine, not taking daily, just PRN  Hypothyroidism; was seeing Dr. Ronnald Collum Lab Results  Component Value Date   TSH 1.250 04/29/2016   Mole on the left leg; has not changed in size, shape, color; bothersome  S/p hysterectomy Looking back through her chart, she had a vaginal sidewall mass that was biopsied back in 2012; it was spindle cell proliferation, favor low grade, results called to Dr. Rayford Halsted on 04/02/10 She says she continues to have discomfort along the side wall She had resection 10/29/11 Dr. Sabra Heck did one surgery, then Dr. Lynelle Smoke did the bigger surgery and got it She says they went back and had a big surgery after that; she went to [facility] for that and they removed it; it started golf ball size and then grew to orange; Dr. Theda Belfast, reviewed notes She has not had f/u since Reviewed note from FEb 2014: had planned to do more MRI f/u but patient does not think that's been done No constipation No blood from below at all No fevers  Procedure:MRI Pelvis with and  without contrast  Indication: 56 y/o Female status post resection of the perirectal mass with pathology showing low-grade smooth muscle tumor, most consistent with leiomyoma  Comparison:MRI from 06/03/11  Technique: Precontrast and dynamic postcontrast MR imaging of the abdomen was performed using the Body Pelvis Protocol. IV contrast was administered to improve disease detection and further define anatomy. 3-D reconstruction was performed to evaluate vascular anatomy.  Contrast agent:20 mL of MultiHance administered intravenously at 2 mL/sec. eGFR: Greater than 60  Premedication/adverse events:None.  Findings:  The patient has undergone interval resection of a left perirectal mass without discrete enhancing tissue at the resection site. No new perirectal lesion is identified. The visualized small bowel and colon are normal in caliber.  The patient is status post hysterectomy. The right ovary is not definitively identified. Cysts lesions measuring up to 1.7 cm are seen within the left adnexa, similar to prior study. No free fluid.  No pathologically enlarged lymph nodes or worrisome osseous lesions.  Impression:  1. Interval resection of the previously described left perirectal mass. No residual or new mass is identified.  I have reviewed the images and concur with the above findings.  Electronically Reviewed KK:XFGHWE Joellyn Haff, MD Electronically Reviewed on:04/17/2012 4:26 PM  Depression screen [facility] 2/9 01/24/2017 10/01/2016 08/13/2016 05/07/2016 09/26/2015  Decreased Interest 0 0 0  0 0  Down, Depressed, Hopeless 0 0 0 0 1  PHQ - 2 Score 0 0 0 0 1   Relevant past medical, surgical, family and social history reviewed Past Medical History:  Diagnosis Date  . Anxiety   . Arthritis   . Breast hypertrophy in female 08/18/2016  . Hypertension   . Thyroid disease    Past Surgical History:  Procedure Laterality Date  . ABDOMINAL HYSTERECTOMY    . APPENDECTOMY    .  COLON SURGERY    . KNEE ARTHROSCOPY Right 07/10/2015  . KNEE ARTHROSCOPY W/ MENISCAL REPAIR Right 05/13/2014  . MEDIAL PARTIAL KNEE REPLACEMENT Right [id number]  . TUBAL LIGATION Bilateral 06/09/2009   Bilateral salphingo-oophorectomy   Family History  Problem Relation Age of Onset  . Diabetes Mother   . Hypertension Mother   . Hypertension Father   . Aneurysm Father        aortic  . Hypertension Sister   . Heart attack Maternal Grandmother   . Heart failure Maternal Grandfather   . Heart attack Paternal Grandmother   . Cancer Paternal Grandfather        not sure   Social History   Socioeconomic History  . Marital status: Married    Spouse name: Not on file  . Number of children: 2  . Years of education: Not on file  . Highest education level: Not on file  Social Needs  . Financial resource strain: Not on file  . Food insecurity - worry: Not on file  . Food insecurity - inability: Not on file  . Transportation needs - medical: Not on file  . Transportation needs - non-medical: Not on file  Occupational History  . Occupation: Counselling psychologist   Tobacco Use  . Smoking status: Former Smoker    Packs/day: 0.50    Years: 20.00    Pack years: 10.00    Types: Cigarettes    Start date: 03/08/1988    Last attempt to quit: 03/08/2008    Years since quitting: 8.8  . Smokeless tobacco: Never Used  Substance and Sexual Activity  . Alcohol use: Yes    Alcohol/week: 0.0 oz    Comment: moderately  . Drug use: No  . Sexual activity: Yes    Partners: Male    Birth control/protection: Surgical  Other Topics Concern  . Not on file  Social History Narrative  . Not on file    Interim medical history since last visit reviewed. Allergies and medications reviewed  Review of Systems Per HPI unless specifically indicated above     Objective:    BP 140/82 (BP Location: Left Arm, Patient Position: Sitting, Cuff Size: Large)   Pulse 93   Temp 98.5 F (36.9 C) (Oral)   Ht 5' 6"   (1.676 m)   Wt 257 lb 3.2 oz (116.7 kg)   SpO2 94%   BMI 41.51 kg/m   Wt Readings from Last 3 Encounters:  01/24/17 257 lb 3.2 oz (116.7 kg)  10/01/16 255 lb 9.6 oz (115.9 kg)  08/13/16 252 lb 11.2 oz (114.6 kg)    Physical Exam  Constitutional: She appears well-developed and well-nourished. No distress.  HENT:  Head: Normocephalic and atraumatic.  Eyes: EOM are normal. No scleral icterus.  Neck: No thyromegaly present.  Cardiovascular: Normal rate and regular rhythm.  Pulmonary/Chest: Effort normal.  Abdominal: She exhibits no distension.  Musculoskeletal: She exhibits no edema.  Neurological: She is alert.  Skin: Skin is warm and dry. She is  not diaphoretic. No pallor.  Firm hyperpigmented lesion on left shin, positive Fitzpatrick's sign  Psychiatric: She has a normal mood and affect.    Results for orders placed or performed in visit on 06/11/16  HM MAMMOGRAPHY  Result Value Ref Range   HM Mammogram Self Reported Normal 0-4 Bi-Rad, Self Reported Normal      Assessment & Plan:   Problem List Items Addressed This Visit      Cardiovascular and Mediastinum   Hypertension goal BP (blood pressure) < 140/90 - Primary    Close to goal; work on weight loss, healthy eating      Relevant Medications   hydrochlorothiazide (HYDRODIURIL) 25 MG tablet   losartan (COZAAR) 100 MG tablet     Digestive   Mass of perirectal soft tissue    Refer back to Dr. Lynelle Smoke at [facility]; spent time reviewing notes with patient, the need for her to get back in for surveillance      Relevant Orders   Ambulatory referral to Colorectal Surgery     Other   Vaginal mass   Morbid obesity ([place])    Patient stopped the [place] on her own; she'll continue to work on this on her own with healthier eating and more movement      Anxiety, generalized    Discussed the medicine she takes for anxiety, not a PRN type medicine; works best if taken daily; she'll try that       Other Visit Diagnoses     Hypertension, goal below 140/90       Relevant Medications   hydrochlorothiazide (HYDRODIURIL) 25 MG tablet   losartan (COZAAR) 100 MG tablet   Dermatofibroma of left lower leg       explained dx; keep an eye on it; contact me with any changes, but believe this is benign lesion       Follow up plan: Return in about 4 months (around 05/24/2017).  An after-visit summary was printed and given to the patient at [facility].  Please see the patient instructions which may contain other information and recommendations beyond what is mentioned above in the assessment and plan.  Meds ordered this encounter  Medications  . hydrochlorothiazide (HYDRODIURIL) 25 MG tablet    Sig: Take 1 tablet (25 mg total) daily by mouth.    Dispense:  90 tablet    Refill:  1  . losartan (COZAAR) 100 MG tablet    Sig: Take 1 tablet (100 mg total) daily by mouth.    Dispense:  90 tablet    Refill:  1    To last until mail order comes in    Orders Placed This Encounter  Procedures  . Ambulatory referral to Colorectal Surgery

## 2017-01-24 NOTE — Assessment & Plan Note (Addendum)
Refer back to Dr. Lynelle Smoke at [facility]; spent time reviewing notes with patient, the need for her to get back in for surveillance

## 2017-01-25 NOTE — Assessment & Plan Note (Signed)
Discussed the medicine she takes for anxiety, not a PRN type medicine; works best if taken daily; she'll try that

## 2017-01-25 NOTE — Assessment & Plan Note (Signed)
>>  ASSESSMENT AND PLAN FOR MORBID OBESITY ([place]) WRITTEN ON 01/25/2017  4:35 PM BY LADA, MELINDA P, MD  Patient stopped the [place] on her own; she'll continue to work on this on her own with healthier eating and more movement

## 2017-01-25 NOTE — Assessment & Plan Note (Signed)
Patient stopped the Saxenda on her own; she'll continue to work on this on her own with healthier eating and more movement

## 2017-03-19 ENCOUNTER — Other Ambulatory Visit: Payer: Self-pay | Admitting: Family Medicine

## 2017-03-20 NOTE — Telephone Encounter (Signed)
The thyroid request is a full six weeks early She's not on lexapro any more Both denied

## 2017-04-24 NOTE — Telephone Encounter (Signed)
I discovered an old note More than a year old Closing out

## 2017-04-24 NOTE — Progress Notes (Signed)
Closing out scanned document note open since  Dec 2017 Wet signed at the time

## 2017-04-24 NOTE — Telephone Encounter (Signed)
I discovered an old note from Jan 2018; patient was seen in March 2018 Closing out old note

## 2017-04-26 NOTE — Progress Notes (Signed)
Closing out scanned document note open since  April 2018

## 2017-05-30 ENCOUNTER — Ambulatory Visit: Payer: 59 | Admitting: Family Medicine

## 2017-06-28 ENCOUNTER — Other Ambulatory Visit: Payer: Self-pay | Admitting: Family Medicine

## 2017-07-01 ENCOUNTER — Other Ambulatory Visit: Payer: Self-pay | Admitting: Family Medicine

## 2017-07-01 DIAGNOSIS — I10 Essential (primary) hypertension: Secondary | ICD-10-CM

## 2017-07-04 NOTE — Telephone Encounter (Signed)
She needs appointment with Dr. Sanda Klein. I will send enough until her follow up. I will need name of local pharmacy

## 2017-07-06 ENCOUNTER — Other Ambulatory Visit: Payer: Self-pay

## 2017-07-06 DIAGNOSIS — I10 Essential (primary) hypertension: Secondary | ICD-10-CM

## 2017-07-08 MED ORDER — HYDROCHLOROTHIAZIDE 25 MG PO TABS: 25.0000 mg | ORAL_TABLET | Freq: Every day | ORAL | 0 refills | Status: DC

## 2017-07-08 MED ORDER — LOSARTAN POTASSIUM 100 MG PO TABS: 100.0000 mg | ORAL_TABLET | Freq: Every day | ORAL | 0 refills | Status: DC

## 2017-07-08 NOTE — Telephone Encounter (Signed)
Send 30 days

## 2017-07-08 NOTE — Telephone Encounter (Signed)
She needs an appointment. I can only send 30 days to local pharmacy, or wait for Dr. Sanda Klein

## 2017-07-12 ENCOUNTER — Telehealth: Payer: Self-pay | Admitting: Family Medicine

## 2017-07-12 MED ORDER — FLUOXETINE HCL 40 MG PO CAPS
40.0000 mg | ORAL_CAPSULE | Freq: Every day | ORAL | 3 refills | Status: DC
Start: 2017-07-12 — End: 2018-08-24

## 2017-07-12 NOTE — Telephone Encounter (Signed)
Copied from [place] [id number]. Topic: Quick Communication - Rx Refill/Question >> Jul 12, 2017  8:48 AM Robina Ade, Helene Kelp D wrote: Medication: FLUoxetine (PROZAC) 40 MG capsule Has the patient contacted their pharmacy? Yes (Agent: If no, request that the patient contact the pharmacy for the refill.) Preferred Pharmacy (with phone number or street name): [id number] - [place], [place] Agent: Please be advised that RX refills may take up to 3 business days. We ask that you follow-up with your pharmacy.

## 2017-07-13 ENCOUNTER — Other Ambulatory Visit: Payer: Self-pay | Admitting: Family Medicine

## 2017-07-13 DIAGNOSIS — I10 Essential (primary) hypertension: Secondary | ICD-10-CM

## 2017-07-13 MED ORDER — HYDROCHLOROTHIAZIDE 25 MG PO TABS: 25.0000 mg | ORAL_TABLET | Freq: Every day | ORAL | 0 refills | Status: DC

## 2017-07-13 MED ORDER — LOSARTAN POTASSIUM 100 MG PO TABS: 100.0000 mg | ORAL_TABLET | Freq: Every day | ORAL | 0 refills | Status: DC

## 2017-07-13 NOTE — Telephone Encounter (Signed)
Copied from [place] [id number]. Topic: Quick Communication - Office Called Patient >> Jul 13, 2017  8:38 AM Orvis Brill B wrote: Reason for CRM: Patient was called and told that per Dr Ancil Boozer she will need a follow up appt with Lada, but Dr Ancil Boozer will send in medication enough till her follow up with Dr. Dr Ancil Boozer needs to know who the Local pharmacy is before she can send this in. Once the patient gived the pharm name send this to [place] so we can send to Dr Ancil Boozer to complete >> Jul 13, 2017  9:11 AM Ahmed Prima L wrote: Patient is scheduled for a f/u for Tuesday may 14 @ 9am. She is aware that her medication is at pharmacy. >> Jul 13, 2017  9:30 AM Orvis Brill B wrote: Called patient myself after note from [facility] and she said that her local pharm is [facility] on [address]. Please send to [place] and she said that she needs all of them and that she has made a follow up appt.   Just got reply, she is going to see you. Do you want to send one week or one month?

## 2017-07-13 NOTE — Telephone Encounter (Signed)
Not sure what happened while I was away

## 2017-07-13 NOTE — Progress Notes (Signed)
30 day supply of two BP meds sent; pt has upcoming appt

## 2017-07-13 NOTE — Telephone Encounter (Signed)
Dr Ancil Boozer wil you [please close this. I have sent you a message back

## 2017-07-19 ENCOUNTER — Encounter: Payer: Self-pay | Admitting: Family Medicine

## 2017-07-19 ENCOUNTER — Ambulatory Visit: Payer: Managed Care, Other (non HMO) | Admitting: Family Medicine

## 2017-07-19 VITALS — BP 132/84 | HR 91 | Temp 98.2°F | Resp 14 | Ht 66.0 in | Wt 261.0 lb

## 2017-07-19 DIAGNOSIS — Z23 Encounter for immunization: Secondary | ICD-10-CM | POA: Diagnosis not present

## 2017-07-19 DIAGNOSIS — D485 Neoplasm of uncertain behavior of skin: Secondary | ICD-10-CM

## 2017-07-19 DIAGNOSIS — E039 Hypothyroidism, unspecified: Secondary | ICD-10-CM

## 2017-07-19 DIAGNOSIS — L853 Xerosis cutis: Secondary | ICD-10-CM

## 2017-07-19 DIAGNOSIS — I10 Essential (primary) hypertension: Secondary | ICD-10-CM | POA: Diagnosis not present

## 2017-07-19 DIAGNOSIS — Z6841 Body Mass Index (BMI) 40.0 and over, adult: Secondary | ICD-10-CM | POA: Diagnosis not present

## 2017-07-19 DIAGNOSIS — Z5181 Encounter for therapeutic drug level monitoring: Secondary | ICD-10-CM

## 2017-07-19 MED ORDER — ARMOUR THYROID 180 MG PO TABS: 180.0000 mg | ORAL_TABLET | Freq: Every morning | ORAL | 3 refills | Status: DC

## 2017-07-19 MED ORDER — TETANUS-DIPHTH-ACELL PERTUSSIS 5-2.5-18.5 LF-MCG/0.5 IM SUSP
0.5000 mL | Freq: Once | INTRAMUSCULAR | Status: AC
  Administered 2017-07-19: 0.5 mL via INTRAMUSCULAR

## 2017-07-19 NOTE — Patient Instructions (Addendum)
Try to follow the DASH guidelines (DASH stands for Dietary Approaches to Stop Hypertension). Try to limit the sodium in your diet to no more than 1,500mg  of sodium per day. Certainly try to not exceed 2,000 mg per day at the very most. Do not add salt when cooking or at the table.  Check the sodium amount on labels when shopping, and choose items lower in sodium when given a choice. Avoid or limit foods that already contain a lot of sodium. Eat a diet rich in fruits and vegetables and whole grains, and try to lose weight if overweight or obese Try to use PLAIN allergy medicine without the decongestant Avoid: phenylephrine, phenylpropanolamine, and pseudoephredine Check out the information at familydoctor.org entitled "Nutrition for Weight Loss: What You Need to Know about Fad Diets" Try to lose between 1-2 pounds per week by taking in fewer calories and burning off more calories You can succeed by limiting portions, limiting foods dense in calories and fat, becoming more active, and drinking 8 glasses of water a day (64 ounces) Don't skip meals, especially breakfast, as skipping meals may alter your metabolism Do not use over-the-counter weight loss pills or gimmicks that claim rapid weight loss A healthy BMI (or body mass index) is between 18.5 and 24.9 You can calculate your ideal BMI at the NIH website ClubMonetize.fr You received the vaccine to protect against tetanus and diphtheria and pertussis today; the tetanus and diphtheria portions will provide protection up to ten years, and the pertussis component will give you protection against whooping cough for life Let's aim for 8 pounds down by the next visit

## 2017-07-19 NOTE — Progress Notes (Signed)
BP 132/84   Pulse 91   Temp 98.2 F (36.8 C) (Oral)   Resp 14   Ht 5\' 6"  (1.676 m)   Wt 261 lb (118.4 kg)   SpO2 96%   BMI 42.13 kg/m    Subjective:    Patient ID: Kristina Peck, female    DOB: 1960/03/19, 57 y.o.   MRN: [id number]  HPI: Kristina Peck is a 57 y.o. female  Chief Complaint  Patient presents with  . Follow-up  . Medication Refill    HPI Patient is here for f/u  Hypothyroidism; feels a little sluggish; weight gain; no constipation; hair is thinning on top; skin is dry over shin and dark spot Lab Results  Component Value Date   TSH 1.250 04/29/2016   HTN; on medicines; bought a BP cuff but it was off; not accurate; tries to avoid salt; some allergies but no decongestants; on medicines  Morbid obesity; weight has gone up since last visit; I offered help, she has tried the pen thing, didn't really like that; tried one of the pills, Contrave; she has a dog and walks him every morning and every evening; getting some exercise there and maybe need to walk faster; getting 30 minutes a day 5 days a week; trying to drink more water; drinks coffee and every morning, fills her up and hard to switch to water after that, but she's trying; started to drink a few soft drinks, which is really a no-no for her she says; that caused some weight, but has switched to unsweetened tea; used to do it all the time; does not want to gain weight back  Prozac is doing well; she ran out and could tell it was working; has to remember to take it every day  Insomnia; trazodone is helping with her sleep just when needed  Depression screen [facility] 2/9 07/19/2017 01/24/2017 10/01/2016 08/13/2016 05/07/2016  Decreased Interest 0 0 0 0 0  Down, Depressed, Hopeless 0 0 0 0 0  PHQ - 2 Score 0 0 0 0 0    Relevant past medical, surgical, family and social history reviewed Past Medical History:  Diagnosis Date  . Anxiety   . Arthritis   . Breast hypertrophy in female 08/18/2016  . Hypertension   .  Thyroid disease    Past Surgical History:  Procedure Laterality Date  . ABDOMINAL HYSTERECTOMY    . APPENDECTOMY    . COLON SURGERY    . KNEE ARTHROSCOPY Right 07/10/2015  . KNEE ARTHROSCOPY W/ MENISCAL REPAIR Right 05/13/2014  . MEDIAL PARTIAL KNEE REPLACEMENT Right [id number]  . TUBAL LIGATION Bilateral 06/09/2009   Bilateral salphingo-oophorectomy   Family History  Problem Relation Age of Onset  . Diabetes Mother   . Hypertension Mother   . Hypertension Father   . Aneurysm Father        aortic  . Hypertension Sister   . Heart attack Maternal Grandmother   . Heart failure Maternal Grandfather   . Heart attack Paternal Grandmother   . Cancer Paternal Grandfather        not sure   Social History   Tobacco Use  . Smoking status: Former Smoker    Packs/day: 0.50    Years: 20.00    Pack years: 10.00    Types: Cigarettes    Start date: 03/08/1988    Last attempt to quit: 03/08/2008    Years since quitting: 9.3  . Smokeless tobacco: Never Used  Substance Use Topics  .  Alcohol use: Yes    Alcohol/week: 0.0 oz    Comment: moderately  . Drug use: No    Interim medical history since last visit reviewed. Allergies and medications reviewed  Review of Systems Per HPI unless specifically indicated above     Objective:    BP 132/84   Pulse 91   Temp 98.2 F (36.8 C) (Oral)   Resp 14   Ht 5\' 6"  (1.676 m)   Wt 261 lb (118.4 kg)   SpO2 96%   BMI 42.13 kg/m   Wt Readings from Last 3 Encounters:  07/19/17 261 lb (118.4 kg)  01/24/17 257 lb 3.2 oz (116.7 kg)  10/01/16 255 lb 9.6 oz (115.9 kg)    Physical Exam  Constitutional: She appears well-developed and well-nourished. No distress.  HENT:  Head: Normocephalic and atraumatic.  Eyes: EOM are normal. No scleral icterus.  Neck: No thyromegaly present.  Cardiovascular: Normal rate, regular rhythm and normal heart sounds.  No murmur heard. Pulmonary/Chest: Effort normal and breath sounds normal. No respiratory distress.  She has no wheezes.  Abdominal: Soft. Bowel sounds are normal. She exhibits no distension.  Musculoskeletal: She exhibits no edema.  Neurological: She is alert. She exhibits normal muscle tone.  Skin: Skin is warm and dry. She is not diaphoretic. No pallor.  Hyperpigmented/darkly pigmented lesion on LEFT shin; dry skin bilateral lower legs  Psychiatric: She has a normal mood and affect. Her behavior is normal. Judgment and thought content normal.    Results for orders placed or performed in visit on 06/11/16  HM MAMMOGRAPHY  Result Value Ref Range   HM Mammogram Self Reported Normal 0-4 Bi-Rad, Self Reported Normal      Assessment & Plan:   Problem List Items Addressed This Visit      Cardiovascular and Mediastinum   Hypertension goal BP (blood pressure) < 140/90     Endocrine   Adult hypothyroidism - Primary   Relevant Medications   ARMOUR THYROID 180 MG tablet   Other Relevant Orders   TSH     Other   Medication monitoring encounter   Relevant Orders   Comprehensive metabolic panel   CBC with Differential/Platelet   Morbid obesity ([place])    later      Relevant Orders   Lipid panel   BMI 40.0-44.9, adult (HCC)    BMI was 42.13 today; see under morbid obesity for plan       Other Visit Diagnoses    Need for Tdap vaccination       Relevant Medications   Tdap (BOOSTRIX) injection 0.5 mL (Completed)   Dry skin       Relevant Orders   Ambulatory referral to Dermatology   Neoplasm of uncertain behavior of skin       Relevant Orders   Ambulatory referral to Dermatology       Follow up plan: Return in about 2 months (around 09/18/2017) for follow-up visit with Dr. Sanda Klein.  An after-visit summary was printed and given to the patient at [place].  Please see the patient instructions which may contain other information and recommendations beyond what is mentioned above in the assessment and plan.  Meds ordered this encounter  Medications  . Tdap (BOOSTRIX)  injection 0.5 mL  . ARMOUR THYROID 180 MG tablet    Sig: Take 1 tablet (180 mg total) by mouth every morning.    Dispense:  90 tablet    Refill:  3    Orders Placed This Encounter  Procedures  . TSH  . Comprehensive metabolic panel  . CBC with Differential/Platelet  . Lipid panel  . Ambulatory referral to Dermatology

## 2017-07-19 NOTE — Assessment & Plan Note (Addendum)
>>  ASSESSMENT AND PLAN FOR BMI 40.0-44.9, ADULT ([place]) WRITTEN ON 07/19/2017  1:31 PM BY Makinzi Prieur P, MD  BMI was 42.13 today; see under morbid obesity for plan  >>ASSESSMENT AND PLAN FOR MORBID OBESITY ([place]) WRITTEN ON 07/19/2017  1:30 PM BY Preston Weill P, MD  later

## 2017-07-19 NOTE — Assessment & Plan Note (Signed)
later

## 2017-09-19 ENCOUNTER — Ambulatory Visit: Payer: Managed Care, Other (non HMO) | Admitting: Family Medicine

## 2017-10-14 ENCOUNTER — Ambulatory Visit: Payer: Managed Care, Other (non HMO) | Admitting: Family Medicine

## 2017-11-14 ENCOUNTER — Ambulatory Visit (INDEPENDENT_AMBULATORY_CARE_PROVIDER_SITE_OTHER): Payer: Managed Care, Other (non HMO) | Admitting: Family Medicine

## 2017-11-14 ENCOUNTER — Encounter: Payer: Self-pay | Admitting: Family Medicine

## 2017-11-14 ENCOUNTER — Other Ambulatory Visit: Payer: Self-pay | Admitting: Family Medicine

## 2017-11-14 VITALS — BP 128/70 | HR 87 | Temp 99.0°F | Ht 66.0 in | Wt 258.1 lb

## 2017-11-14 DIAGNOSIS — G4700 Insomnia, unspecified: Secondary | ICD-10-CM

## 2017-11-14 DIAGNOSIS — Z5181 Encounter for therapeutic drug level monitoring: Secondary | ICD-10-CM

## 2017-11-14 DIAGNOSIS — F419 Anxiety disorder, unspecified: Secondary | ICD-10-CM

## 2017-11-14 DIAGNOSIS — Z6841 Body Mass Index (BMI) 40.0 and over, adult: Secondary | ICD-10-CM

## 2017-11-14 DIAGNOSIS — Z23 Encounter for immunization: Secondary | ICD-10-CM | POA: Diagnosis not present

## 2017-11-14 DIAGNOSIS — E039 Hypothyroidism, unspecified: Secondary | ICD-10-CM | POA: Diagnosis not present

## 2017-11-14 DIAGNOSIS — I10 Essential (primary) hypertension: Secondary | ICD-10-CM

## 2017-11-14 DIAGNOSIS — Z1231 Encounter for screening mammogram for malignant neoplasm of breast: Secondary | ICD-10-CM

## 2017-11-14 MED ORDER — FLUOXETINE HCL 20 MG PO TABS: 20.0000 mg | ORAL_TABLET | Freq: Every day | ORAL | 3 refills | Status: DC

## 2017-11-14 NOTE — Patient Instructions (Signed)
Check out the information at familydoctor.org entitled "Nutrition for Weight Loss: What You Need to Know about Fad Diets" Try to lose between 1-2 pounds per week by taking in fewer calories and burning off more calories You can succeed by limiting portions, limiting foods dense in calories and fat, becoming more active, and drinking 8 glasses of water a day (64 ounces) Don't skip meals, especially breakfast, as skipping meals may alter your metabolism Do not use over-the-counter weight loss pills or gimmicks that claim rapid weight loss A healthy BMI (or body mass index) is between 18.5 and 24.9 You can calculate your ideal BMI at the NIH website http://www.nhlbi.nih.gov/health/educational/lose_wt/BMI/bmicalc.htm  

## 2017-11-14 NOTE — Progress Notes (Signed)
BP 128/70   Pulse 87   Temp 99 F (37.2 C)   Ht 5\' 6"  (1.676 m)   Wt 258 lb 1.6 oz (117.1 kg)   SpO2 98%   BMI 41.66 kg/m    Subjective:    Patient ID: Kristina Peck, female    DOB: [birth year], 57 y.o.   MRN: [id number]  HPI: Kristina Peck is a 57 y.o. female  Chief Complaint  Patient presents with  . Follow-up    HPI  Patient here for biometric work screening and routine follow-up. She has lost 3.5 pounds since last visit; she is trying to walk the dog twice a day; needs to drink more water; not sure how many steps she gets  Hypothyroidism: 180 mcg armour; notes dry skin in certain areas. Denies palpitations, cold/hot intolerances, constipation/diarrhea  Anxiety: Prozac 40mg  daily, states not sure if the medicine is working or if she is controlling it herself.  No side effects with medication;   Hypertension Losartan 100mg ; HCTZ 25mg - well controlled. Had Cr just checked BP Readings from Last 3 Encounters:  11/14/17 128/70  07/19/17 132/84  01/24/17 140/82   Insomnia: takes trazadone 50mg ; she will fall asleep but doesn't stay asleep well; maybe wakes up 3-4 nights a week; falls back to sleep; not up too long; not much caffeine  Morbid obesity; walking, but not sure about how many steps; will try to more water; walking dog 2x a day, 30 minutes every day; she does not eat after 8 pm  Depression screen [facility] 2/9 11/14/2017 07/19/2017 01/24/2017 10/01/2016 08/13/2016  Decreased Interest 3 0 0 0 0  Down, Depressed, Hopeless 1 0 0 0 0  PHQ - 2 Score 4 0 0 0 0  Altered sleeping 1 - - - -  Tired, decreased energy 1 - - - -  Change in appetite 0 - - - -  Feeling bad or failure about yourself  0 - - - -  Trouble concentrating 1 - - - -  Moving slowly or fidgety/restless 0 - - - -  Suicidal thoughts 0 - - - -  PHQ-9 Score 7 - - - -  Difficult doing work/chores Not difficult at all - - - -    Relevant past medical, surgical, family and social history reviewed Past Medical  History:  Diagnosis Date  . Anxiety   . Arthritis   . Breast hypertrophy in female 08/18/2016  . Hypertension   . Thyroid disease    Past Surgical History:  Procedure Laterality Date  . ABDOMINAL HYSTERECTOMY    . APPENDECTOMY    . COLON SURGERY    . KNEE ARTHROSCOPY Right 07/10/2015  . KNEE ARTHROSCOPY W/ MENISCAL REPAIR Right 05/13/2014  . MEDIAL PARTIAL KNEE REPLACEMENT Right [id number]  . TUBAL LIGATION Bilateral 06/09/2009   Bilateral salphingo-oophorectomy   Family History  Problem Relation Age of Onset  . Diabetes Mother   . Hypertension Mother   . Hypertension Father   . Aneurysm Father        aortic  . Hypertension Sister   . Heart attack Maternal Grandmother   . Heart failure Maternal Grandfather   . Heart attack Paternal Grandmother   . Cancer Paternal Grandfather        not sure  . Breast cancer Neg Hx    Social History   Tobacco Use  . Smoking status: Former Smoker    Packs/day: 0.50    Years: 20.00    Pack years: 10.00  Types: Cigarettes    Start date: 03/08/1988    Last attempt to quit: 03/08/2008    Years since quitting: 9.7  . Smokeless tobacco: Never Used  Substance Use Topics  . Alcohol use: Yes    Alcohol/week: 0.0 standard drinks    Comment: moderately  . Drug use: No    Interim medical history since last visit reviewed. Allergies and medications reviewed  Review of Systems Per HPI unless specifically indicated above     Objective:    BP 128/70   Pulse 87   Temp 99 F (37.2 C)   Ht 5\' 6"  (1.676 m)   Wt 258 lb 1.6 oz (117.1 kg)   SpO2 98%   BMI 41.66 kg/m   Wt Readings from Last 3 Encounters:  11/14/17 258 lb 1.6 oz (117.1 kg)  07/19/17 261 lb (118.4 kg)  01/24/17 257 lb 3.2 oz (116.7 kg)    Physical Exam  Constitutional: She appears well-developed and well-nourished. No distress.  Morbidly obese  HENT:  Head: Normocephalic and atraumatic.  Eyes: EOM are normal. No scleral icterus.  Neck: No thyromegaly present.    Cardiovascular: Normal rate, regular rhythm and normal heart sounds.  No murmur heard. Pulmonary/Chest: Effort normal and breath sounds normal. No respiratory distress. She has no wheezes.  Abdominal: Soft. Bowel sounds are normal. She exhibits no distension.  Musculoskeletal: She exhibits no edema.  Neurological: She is alert.  Skin: Skin is warm and dry. She is not diaphoretic. No pallor.  Psychiatric: She has a normal mood and affect. Her behavior is normal. Judgment and thought content normal.    Results for orders placed or performed in visit on 06/11/16  HM MAMMOGRAPHY  Result Value Ref Range   HM Mammogram Self Reported Normal 0-4 Bi-Rad, Self Reported Normal      Assessment & Plan:   Problem List Items Addressed This Visit      Cardiovascular and Mediastinum   Hypertension goal BP (blood pressure) < 140/90    Controlled; continue medicine        Endocrine   Adult hypothyroidism - Primary   Relevant Orders   TSH     Other   Medication monitoring encounter   Relevant Orders   Hepatic function panel   CBC with Differential/Platelet   Potassium   Morbid obesity ([place])    Encouragement given; discussed diet, activity, water intake; see form completed for work      Insomnia    Trazodone; good sleep hygiene      BMI 40.0-44.9, adult ([place])    BMI 41; working with patient to lose weight; see form for recommendations      Anxiety    Continue SSRI      Relevant Medications   FLUoxetine (PROZAC) 20 MG tablet    Other Visit Diagnoses    Need for influenza vaccination       Relevant Orders   Flu Vaccine QUAD 6+ mos PF IM (Fluarix Quad PF) (Completed)       Follow up plan: No follow-ups on file.  An after-visit summary was printed and given to the patient at [place].  Please see the patient instructions which may contain other information and recommendations beyond what is mentioned above in the assessment and plan.  Meds ordered this encounter   Medications  . FLUoxetine (PROZAC) 20 MG tablet    Sig: Take 1 tablet (20 mg total) by mouth daily. In addition to the 40 mg = 60 mg daily  Dispense:  90 tablet    Refill:  3    Orders Placed This Encounter  Procedures  . Flu Vaccine QUAD 6+ mos PF IM (Fluarix Quad PF)  . TSH  . Hepatic function panel  . CBC with Differential/Platelet  . Potassium

## 2017-11-16 ENCOUNTER — Other Ambulatory Visit: Payer: Self-pay | Admitting: Family Medicine

## 2017-11-17 ENCOUNTER — Telehealth: Payer: Self-pay | Admitting: Family Medicine

## 2017-11-17 NOTE — Telephone Encounter (Signed)
Outside labs showed patient is not immune to Mumps Did she get booster (MMR)? If not, I recommend it

## 2017-11-17 NOTE — Telephone Encounter (Signed)
Left detailed VM.  

## 2017-11-18 ENCOUNTER — Ambulatory Visit
Admission: RE | Admit: 2017-11-18 | Discharge: 2017-11-18 | Disposition: A | Discharge status: Home / Self Care | Payer: Managed Care, Other (non HMO) | Source: Ambulatory Visit | Attending: Family Medicine | Admitting: Family Medicine

## 2017-11-18 DIAGNOSIS — Z1231 Encounter for screening mammogram for malignant neoplasm of breast: Secondary | ICD-10-CM | POA: Diagnosis present

## 2017-11-23 DIAGNOSIS — F419 Anxiety disorder, unspecified: Secondary | ICD-10-CM | POA: Insufficient documentation

## 2017-11-23 NOTE — Assessment & Plan Note (Signed)
Encouragement given; discussed diet, activity, water intake; see form completed for work

## 2017-11-23 NOTE — Assessment & Plan Note (Signed)
Controlled; continue medicine 

## 2017-11-23 NOTE — Assessment & Plan Note (Signed)
Trazodone; good sleep hygiene

## 2017-11-23 NOTE — Assessment & Plan Note (Signed)
- 

## 2017-11-23 NOTE — Assessment & Plan Note (Addendum)
>>  ASSESSMENT AND PLAN FOR BMI 40.0-44.9, ADULT ([place]) WRITTEN ON 11/23/2017  9:38 AM BY Treyshaun Keatts P, MD  BMI 41; working with patient to lose weight; see form for recommendations  >>ASSESSMENT AND PLAN FOR MORBID OBESITY ([place]) WRITTEN ON 11/23/2017  9:37 AM BY Ilka Lovick P, MD  Encouragement given; discussed diet, activity, water intake; see form completed for work

## 2017-12-02 ENCOUNTER — Other Ambulatory Visit: Payer: Self-pay | Admitting: Family Medicine

## 2017-12-03 LAB — HEPATIC FUNCTION PANEL
ALK PHOS: 105 IU/L (ref 39–117)
ALT: 11 IU/L (ref 0–32)
AST: 13 IU/L (ref 0–40)
Albumin: 4.1 g/dL (ref 3.5–5.5)
BILIRUBIN TOTAL: 0.3 mg/dL (ref 0.0–1.2)
BILIRUBIN, DIRECT: 0.09 mg/dL (ref 0.00–0.40)
Total Protein: 6.9 g/dL (ref 6.0–8.5)

## 2017-12-03 LAB — CBC WITH DIFFERENTIAL/PLATELET
Basophils Absolute: 0 10*3/uL (ref 0.0–0.2)
Basos: 0 %
EOS (ABSOLUTE): 0.1 10*3/uL (ref 0.0–0.4)
Eos: 3 %
Hematocrit: 36.3 % (ref 34.0–46.6)
Hemoglobin: 11.9 g/dL (ref 11.1–15.9)
IMMATURE GRANULOCYTES: 0 %
Immature Grans (Abs): 0 10*3/uL (ref 0.0–0.1)
LYMPHS ABS: 2.2 10*3/uL (ref 0.7–3.1)
Lymphs: 39 %
MCH: 27.6 pg (ref 26.6–33.0)
MCHC: 32.8 g/dL (ref 31.5–35.7)
MCV: 84 fL (ref 79–97)
MONOS ABS: 0.3 10*3/uL (ref 0.1–0.9)
Monocytes: 5 %
Neutrophils Absolute: 2.9 10*3/uL (ref 1.4–7.0)
Neutrophils: 53 %
PLATELETS: 360 10*3/uL (ref 150–450)
RBC: 4.31 x10E6/uL (ref 3.77–5.28)
RDW: 13.2 % (ref 12.3–15.4)
WBC: 5.5 10*3/uL (ref 3.4–10.8)

## 2017-12-03 LAB — TSH: TSH: <0.006 u[IU]/mL — ABNORMAL LOW (ref 0.450–4.500)

## 2017-12-03 LAB — POTASSIUM: Potassium: 3.8 mmol/L (ref 3.5–5.2)

## 2017-12-05 ENCOUNTER — Other Ambulatory Visit: Payer: Self-pay | Admitting: Family Medicine

## 2017-12-05 MED ORDER — TRAZODONE HCL 50 MG PO TABS: 25.0000 mg | ORAL_TABLET | Freq: Every evening | ORAL | 1 refills | Status: DC | PRN

## 2017-12-05 MED ORDER — THYROID 120 MG PO TABS: 120.0000 mg | ORAL_TABLET | Freq: Every day | ORAL | 1 refills | Status: DC

## 2017-12-05 NOTE — Progress Notes (Signed)
Stop the 180 strength; start 120 strength; recheck labs in 4 weeks

## 2017-12-06 ENCOUNTER — Other Ambulatory Visit: Payer: Self-pay

## 2017-12-06 ENCOUNTER — Telehealth: Payer: Self-pay

## 2017-12-06 DIAGNOSIS — E039 Hypothyroidism, unspecified: Secondary | ICD-10-CM

## 2017-12-06 MED ORDER — THYROID 120 MG PO TABS: 120.0000 mg | ORAL_TABLET | Freq: Every day | ORAL | 1 refills | Status: DC

## 2017-12-06 NOTE — Telephone Encounter (Signed)
-----   Message from Stark Klein sent at 12/06/2017  3:08 PM EDT ----- Acquanetta Belling to correct office. Sorry for the delay.

## 2017-12-17 ENCOUNTER — Other Ambulatory Visit: Payer: Self-pay | Admitting: Family Medicine

## 2017-12-17 DIAGNOSIS — I10 Essential (primary) hypertension: Secondary | ICD-10-CM

## 2017-12-19 NOTE — Assessment & Plan Note (Signed)
Order lipid panel to be done with next thyroid labs

## 2017-12-19 NOTE — Assessment & Plan Note (Signed)
>>  ASSESSMENT AND PLAN FOR MORBID OBESITY ([place]) WRITTEN ON 12/19/2017  9:07 AM BY LADA, MELINDA P, MD  Order lipid panel to be done with next thyroid labs

## 2018-02-13 ENCOUNTER — Other Ambulatory Visit: Payer: Self-pay | Admitting: Family Medicine

## 2018-02-14 ENCOUNTER — Ambulatory Visit: Payer: Managed Care, Other (non HMO) | Admitting: Family Medicine

## 2018-03-07 ENCOUNTER — Other Ambulatory Visit: Payer: Self-pay

## 2018-03-07 DIAGNOSIS — I10 Essential (primary) hypertension: Secondary | ICD-10-CM

## 2018-03-07 NOTE — Addendum Note (Signed)
Addended by: Docia Furl on: 03/07/2018 03:19 PM   Modules accepted: Orders

## 2018-03-10 ENCOUNTER — Ambulatory Visit: Payer: Managed Care, Other (non HMO) | Admitting: Family Medicine

## 2018-03-27 ENCOUNTER — Other Ambulatory Visit: Payer: Self-pay | Admitting: Family Medicine

## 2018-03-28 LAB — LIPID PANEL
CHOLESTEROL TOTAL: 205 mg/dL — AB (ref 100–199)
Chol/HDL Ratio: 4 ratio (ref 0.0–4.4)
HDL: 51 mg/dL (ref >39)
LDL Calculated: 139 mg/dL — ABNORMAL HIGH (ref 0–99)
Triglycerides: 75 mg/dL (ref 0–149)
VLDL Cholesterol Cal: 15 mg/dL (ref 5–40)

## 2018-03-28 LAB — T4: T4 TOTAL: 6.2 ug/dL (ref 4.5–12.0)

## 2018-03-28 LAB — TSH: TSH: 28.05 u[IU]/mL — ABNORMAL HIGH (ref 0.450–4.500)

## 2018-03-28 LAB — T3, FREE: T3, Free: 1.6 pg/mL — ABNORMAL LOW (ref 2.0–4.4)

## 2018-03-29 ENCOUNTER — Other Ambulatory Visit: Payer: Self-pay | Admitting: Family Medicine

## 2018-03-29 MED ORDER — THYROID 30 MG PO TABS: 30.0000 mg | ORAL_TABLET | Freq: Every day | ORAL | 0 refills | Status: DC

## 2018-03-29 NOTE — Progress Notes (Signed)
Kristina Peck, please let the patient know that her cholesterol is high; her risk of having a heart attack or other major atherosclerotic event in the next 10 years is 5.4%; we're supposed to offer cholesterol-lowering medicine if above 5%; does she want medicine in addition to working on her diet, or does she want to really buckle down on weight, exercise, and healthy eating? Let me know; recheck lipids in 6 weeks if medicine desired, 12 weeks if diet/exercise wanted Her thyroid labs are way out of range; please REFER to endocrinologist; her previous set of labs was abnormal and I had hoped to get labs 4 weeks after that set (that would have been October); it's not January and they are off, so we'll need an endocrinologist to manage and dose her medicines; I will add an extra 30 mg of Armour thyroid to her current 120 mg dose (120 + 30 = 150 mg daily); new Rx sent for the 30 mg already; endo should be rechecking her labs soon  The 10-year ASCVD risk score Kristina Peck., et al., 2013) is: 5.4%   Values used to calculate the score:     Age: 58 years     Sex: Female     Is Non-Hispanic African American: Yes     Diabetic: No     Tobacco smoker: No     Systolic Blood Pressure: 465 mmHg     Is BP treated: Yes     HDL Cholesterol: 51 mg/dL     Total Cholesterol: 205 mg/dL

## 2018-03-30 ENCOUNTER — Other Ambulatory Visit: Payer: Self-pay

## 2018-03-30 DIAGNOSIS — E039 Hypothyroidism, unspecified: Secondary | ICD-10-CM

## 2018-03-30 MED ORDER — THYROID 120 MG PO TABS: 120.0000 mg | ORAL_TABLET | Freq: Every day | ORAL | 1 refills | Status: DC

## 2018-03-30 NOTE — Telephone Encounter (Signed)
You sent in 30mg  dosage, but she also needs the 120mg  dose to make the new dose you would like for her to take the 150mg .

## 2018-04-03 ENCOUNTER — Other Ambulatory Visit: Payer: Self-pay | Admitting: Family Medicine

## 2018-04-03 NOTE — Telephone Encounter (Signed)
Copied from [place] [id number]. Topic: Quick Communication - See Telephone Encounter >> Apr 03, 2018  4:48 PM Blase Mess A wrote: CRM for notification. See Telephone encounter for: 04/03/18.  Patient is calling regarding thyroid (ARMOUR THYROID) 120 MG tablet [[id number]] & thyroid (ARMOUR THYROID) 30 MG tablet [[id number]] 90 days were sent to the wrong pharmacy can these be sent to [place], [place] [phone] (Phone) [phone] (Fax)

## 2018-04-04 MED ORDER — THYROID 30 MG PO TABS: 30.0000 mg | ORAL_TABLET | Freq: Every day | ORAL | 1 refills | Status: DC

## 2018-04-04 MED ORDER — THYROID 120 MG PO TABS: 120.0000 mg | ORAL_TABLET | Freq: Every day | ORAL | 1 refills | Status: DC

## 2018-04-04 MED ORDER — THYROID 120 MG PO TABS: 120.0000 mg | ORAL_TABLET | Freq: Every day | ORAL | 0 refills | Status: DC

## 2018-04-04 MED ORDER — THYROID 30 MG PO TABS: 30.0000 mg | ORAL_TABLET | Freq: Every day | ORAL | 0 refills | Status: DC

## 2018-04-04 NOTE — Addendum Note (Signed)
Addended by: Docia Furl on: 04/04/2018 12:22 PM   Modules accepted: Orders

## 2018-04-04 NOTE — Telephone Encounter (Signed)
Pt states that for her insurance to cover the medication it must be sent in as a 90 day supply but what was sent was only for 30 days.  Pt would like to have this corrected. Pt would also like a call after this has been done to confirm.

## 2018-04-04 NOTE — Addendum Note (Signed)
Addended by: Robbert Langlinais, Satira Anis on: 04/04/2018 05:38 PM   Modules accepted: Orders

## 2018-04-04 NOTE — Telephone Encounter (Signed)
Please tee them up for appropriate quantity and correct pharmacy

## 2018-04-04 NOTE — Telephone Encounter (Signed)
Rx sent Please explain that even though a 90 day supply was sent, the endocrinologist should be checking up on this in 6 weeks She cannot go 90 days without having her blood rechecked

## 2018-04-05 NOTE — Telephone Encounter (Signed)
Pt.notified

## 2018-04-07 ENCOUNTER — Encounter: Payer: Self-pay | Admitting: Family Medicine

## 2018-04-07 ENCOUNTER — Ambulatory Visit: Payer: Managed Care, Other (non HMO) | Admitting: Family Medicine

## 2018-04-07 VITALS — BP 124/72 | HR 99 | Temp 98.3°F | Ht 66.0 in | Wt 264.6 lb

## 2018-04-07 DIAGNOSIS — I10 Essential (primary) hypertension: Secondary | ICD-10-CM

## 2018-04-07 DIAGNOSIS — E039 Hypothyroidism, unspecified: Secondary | ICD-10-CM | POA: Diagnosis not present

## 2018-04-07 DIAGNOSIS — Z6841 Body Mass Index (BMI) 40.0 and over, adult: Secondary | ICD-10-CM

## 2018-04-07 DIAGNOSIS — E789 Disorder of lipoprotein metabolism, unspecified: Secondary | ICD-10-CM | POA: Diagnosis not present

## 2018-04-07 DIAGNOSIS — Z1211 Encounter for screening for malignant neoplasm of colon: Secondary | ICD-10-CM

## 2018-04-07 DIAGNOSIS — H9193 Unspecified hearing loss, bilateral: Secondary | ICD-10-CM

## 2018-04-07 DIAGNOSIS — Z800 Family history of malignant neoplasm of digestive organs: Secondary | ICD-10-CM

## 2018-04-07 NOTE — Progress Notes (Signed)
BP 124/72   Pulse 99   Temp 98.3 F (36.8 C)   Ht 5\' 6"  (1.676 m)   Wt 264 lb 9.6 oz (120 kg)   SpO2 96%   BMI 42.71 kg/m    Subjective:    Patient ID: Kristina Peck, female    DOB: 10-19-1960, 58 y.o.   MRN: [id number]  HPI: Kristina Peck is a 58 y.o. female  Chief Complaint  Patient presents with  . Follow-up    HPI Here for f/u Hypothyroidism; recent dose change; going to see endo Feb 12th Stools have been loose, weight is going up despite same eating; hair loss noticeable Insurance company issues delayed getting medicine right away Now taking 150 for about 2 days  No prior hx of high cholesterol; recent numbers may have been affected by thyroid Total chol 205; HDL 51, LDL 139 Would like to try checking again  HTN; not checking BP away from doctor; not using extra salt  Obesity; affected by thyroid issues, dose adjustments; not doing any weight loss plan, but trying to manage what she eats, more salads and greens, chicken; not a good water drinker she admits; she'll get back to it  HM list: due for pap smear; has not seen gyn for a few years; mammogram UTD  Decreased hearing; uses the right ear when on conference call, uses that, not sure if one person talking sounds muffled, has to take headset off and turn speaker on to hear more clearly; she may hear a M instead of an N; husband noticed volume louder on TV  Depression screen [facility] 2/9 04/07/2018 11/14/2017 07/19/2017 01/24/2017 10/01/2016  Decreased Interest 0 3 0 0 0  Down, Depressed, Hopeless 0 1 0 0 0  PHQ - 2 Score 0 4 0 0 0  Altered sleeping 0 1 - - -  Tired, decreased energy 0 1 - - -  Change in appetite 0 0 - - -  Feeling bad or failure about yourself  0 0 - - -  Trouble concentrating 0 1 - - -  Moving slowly or fidgety/restless 0 0 - - -  Suicidal thoughts 0 0 - - -  PHQ-9 Score 0 7 - - -  Difficult doing work/chores Not difficult at all Not difficult at all - - -   Fall Risk  04/07/2018 11/14/2017  07/19/2017 01/24/2017 10/01/2016  Falls in the past year? 0 No No Yes No  Number falls in past yr: - - - 1 -  Injury with Fall? - - - No -    Relevant past medical, surgical, family and social history reviewed Past Medical History:  Diagnosis Date  . Anxiety   . Arthritis   . Breast hypertrophy in female 08/18/2016  . Hypertension   . Thyroid disease    Past Surgical History:  Procedure Laterality Date  . ABDOMINAL HYSTERECTOMY    . APPENDECTOMY    . COLON SURGERY    . KNEE ARTHROSCOPY Right 07/10/2015  . KNEE ARTHROSCOPY W/ MENISCAL REPAIR Right 05/13/2014  . MEDIAL PARTIAL KNEE REPLACEMENT Right [id number]  . TUBAL LIGATION Bilateral 06/09/2009   Bilateral salphingo-oophorectomy   Family History  Problem Relation Age of Onset  . Diabetes Mother   . Hypertension Mother   . Hypertension Father   . Aneurysm Father        aortic  . Hypertension Sister   . Heart attack Maternal Grandmother   . Heart failure Maternal Grandfather   . Heart  attack Paternal Grandmother   . Cancer Paternal Grandfather        not sure  . Breast cancer Neg Hx   PGF had colon cancer  Social History   Tobacco Use  . Smoking status: Former Smoker    Packs/day: 0.50    Years: 20.00    Pack years: 10.00    Types: Cigarettes    Start date: 03/08/1988    Last attempt to quit: 03/08/2008    Years since quitting: 10.1  . Smokeless tobacco: Never Used  Substance Use Topics  . Alcohol use: Yes    Alcohol/week: 0.0 standard drinks    Comment: moderately  . Drug use: No     Office Visit from 04/07/2018 in [facility]  AUDIT-C Score  1      Interim medical history since last visit reviewed. Allergies and medications reviewed  Review of Systems Per HPI unless specifically indicated above     Objective:    BP 124/72   Pulse 99   Temp 98.3 F (36.8 C)   Ht 5\' 6"  (1.676 m)   Wt 264 lb 9.6 oz (120 kg)   SpO2 96%   BMI 42.71 kg/m   Wt Readings from Last 3 Encounters:    04/07/18 264 lb 9.6 oz (120 kg)  11/14/17 258 lb 1.6 oz (117.1 kg)  07/19/17 261 lb (118.4 kg)    Physical Exam Constitutional:      General: She is not in acute distress.    Appearance: She is well-developed. She is obese. She is not diaphoretic.     Comments: Morbidly obese  HENT:     Head: Normocephalic and atraumatic.  Eyes:     General: No scleral icterus. Neck:     Thyroid: No thyromegaly.  Cardiovascular:     Rate and Rhythm: Normal rate and regular rhythm.     Heart sounds: Normal heart sounds. No murmur.  Pulmonary:     Effort: Pulmonary effort is normal. No respiratory distress.     Breath sounds: Normal breath sounds. No wheezing.  Abdominal:     General: Bowel sounds are normal. There is no distension.     Palpations: Abdomen is soft.  Skin:    General: Skin is warm and dry.     Coloration: Skin is not pale.  Neurological:     Mental Status: She is alert.  Psychiatric:        Mood and Affect: Mood is not anxious or depressed.        Behavior: Behavior normal.        Thought Content: Thought content normal.        Judgment: Judgment normal.        Assessment & Plan:   Problem List Items Addressed This Visit      Cardiovascular and Mediastinum   Hypertension goal BP (blood pressure) < 140/90    Controlled today        Endocrine   Adult hypothyroidism - Primary    Referred to endo; having weight gain, change in stool, thin hair; recent adjustments and endo to manage going forward; status post radioactive iodine therapy        Other   Borderline high cholesterol    May be related to thyroid dose being off; patiewnt wishes to wait until numbers are better, then rechecklipids in 3 months; encouraged decrease sat fats      Relevant Orders   Lipid panel   BMI 40.0-44.9, adult ([place])  Working on weight loss, drink more water; see AVS       Other Visit Diagnoses    Screening for colon cancer       Relevant Orders   Ambulatory referral to  Gastroenterology   Family history of malignant neoplasm of colon       Relevant Orders   Ambulatory referral to Gastroenterology   Decreased hearing of both ears       Relevant Orders   Ambulatory referral to Audiology       Follow up plan: Return in about 6 months (around 10/06/2018) for follow-up visit with Dr. Sanda Klein; physical with pap smear soon.  An after-visit summary was printed and given to the patient at [place].  Please see the patient instructions which may contain other information and recommendations beyond what is mentioned above in the assessment and plan.  No orders of the defined types were placed in this encounter.   Orders Placed This Encounter  Procedures  . Lipid panel  . Ambulatory referral to Gastroenterology  . Ambulatory referral to Audiology

## 2018-04-07 NOTE — Patient Instructions (Addendum)
Try to limit saturated fats in your diet (bologna, hot dogs, barbeque, cheeseburgers, hamburgers, steak, bacon, sausage, cheese, etc.) and get more fresh fruits, vegetables, and whole grains  Check out the information at familydoctor.org entitled "Nutrition for Weight Loss: What You Need to Know about Fad Diets" Try to lose between 1-2 pounds per week by taking in fewer calories and burning off more calories You can succeed by limiting portions, limiting foods dense in calories and fat, becoming more active, and drinking 8 glasses of water a day (64 ounces) Don't skip meals, especially breakfast, as skipping meals may alter your metabolism Do not use over-the-counter weight loss pills or gimmicks that claim rapid weight loss A healthy BMI (or body mass index) is between 18.5 and 24.9 You can calculate your ideal BMI at the [place] website ClubMonetize.fr   Obesity, Adult Obesity is the condition of having too much total body fat. Being overweight or obese means that your weight is greater than what is considered healthy for your body size. Obesity is determined by a measurement called BMI. BMI is an estimate of body fat and is calculated from height and weight. For adults, a BMI of 30 or higher is considered obese. Obesity can eventually lead to other health concerns and major illnesses, including:  Stroke.  Coronary artery disease (CAD).  Type 2 diabetes.  Some types of cancer, including cancers of the colon, breast, uterus, and gallbladder.  Osteoarthritis.  High blood pressure (hypertension).  High cholesterol.  Sleep apnea.  Gallbladder stones.  Infertility problems. What are the causes? The main cause of obesity is taking in (consuming) more calories than your body uses for energy. Other factors that contribute to this condition may include:  Being born with genes that make you more likely to become obese.  Having a medical  condition that causes obesity. These conditions include: ? Hypothyroidism. ? Polycystic ovarian syndrome (PCOS). ? Binge-eating disorder. ? Cushing syndrome.  Taking certain medicines, such as steroids, antidepressants, and seizure medicines.  Not being physically active (sedentary lifestyle).  Living where there are limited places to exercise safely or buy healthy foods.  Not getting enough sleep. What increases the risk? The following factors may increase your risk of this condition:  Having a family history of obesity.  Being a woman of African-American descent.  Being a man of Hispanic descent. What are the signs or symptoms? Having excessive body fat is the main symptom of this condition. How is this diagnosed? This condition may be diagnosed based on:  Your symptoms.  Your medical history.  A physical exam. Your health care provider may measure: ? Your BMI. If you are an adult with a BMI between 25 and less than 30, you are considered overweight. If you are an adult with a BMI of 30 or higher, you are considered obese. ? The distances around your hips and your waist (circumferences). These may be compared to each other to help diagnose your condition. ? Your skinfold thickness. Your health care provider may gently pinch a fold of your skin and measure it. How is this treated? Treatment for this condition often includes changing your lifestyle. Treatment may include some or all of the following:  Dietary changes. Work with your health care provider and a dietitian to set a weight-loss goal that is healthy and reasonable for you. Dietary changes may include eating: ? Smaller portions. A portion size is the amount of a particular food that is healthy for you to eat at one  varies from person to person. ? Low-calorie or low-fat options. ? More whole grains, fruits, and vegetables.  Regular physical activity. This may include aerobic activity (cardio) and strength  training.  Medicine to help you lose weight. Your health care provider may prescribe medicine if you are unable to lose 1 pound a week after 6 weeks of eating more healthily and doing more physical activity.  Surgery. Surgical options may include gastric banding and gastric bypass. Surgery may be done if: ? Other treatments have not helped to improve your condition. ? You have a BMI of 40 or higher. ? You have life-threatening health problems related to obesity. Follow these instructions at home:  Eating and drinking   Follow recommendations from your health care provider about what you eat and drink. Your health care provider may advise you to: ? Limit fast foods, sweets, and processed snack foods. ? Choose low-fat options, such as low-fat milk instead of whole milk. ? Eat 5 or more servings of fruits or vegetables every day. ? Eat at home more often. This gives you more control over what you eat. ? Choose healthy foods when you eat out. ? Learn what a healthy portion size is. ? Keep low-fat snacks on hand. ? Avoid sugary drinks, such as soda, fruit juice, iced tea sweetened with sugar, and flavored milk. ? Eat a healthy breakfast.  Drink enough water to keep your urine clear or pale yellow.  Do not go without eating for long periods of time (do not fast) or follow a fad diet. Fasting and fad diets can be unhealthy and even dangerous. Physical Activity  Exercise regularly, as told by your health care provider. Ask your health care provider what types of exercise are safe for you and how often you should exercise.  Warm up and stretch before being active.  Cool down and stretch after being active.  Rest between periods of activity. Lifestyle  Limit the time that you spend in front of your TV, computer, or video game system.  Find ways to reward yourself that do not involve food.  Limit alcohol intake to no more than 1 drink a day for nonpregnant women and 2 drinks a day for  men. One drink equals 12 oz of beer, 5 oz of wine, or 1 oz of hard liquor. General instructions  Keep a weight loss journal to keep track of the food you eat and how much you exercise you get.  Take over-the-counter and prescription medicines only as told by your health care provider.  Take vitamins and supplements only as told by your health care provider.  Consider joining a support group. Your health care provider may be able to recommend a support group.  Keep all follow-up visits as told by your health care provider. This is important. Contact a health care provider if:  You are unable to meet your weight loss goal after 6 weeks of dietary and lifestyle changes. This information is not intended to replace advice given to you by your health care provider. Make sure you discuss any questions you have with your health care provider. Document Released: 04/01/2004 Document Revised: 07/28/2015 Document Reviewed: 12/11/2014 Elsevier Interactive Patient Education  2019 Elsevier Inc.  Preventing Unhealthy Weight Gain, Adult Staying at a healthy weight is important to your overall health. When fat builds up in your body, you may become overweight or obese. Being overweight or obese increases your risk of developing certain health problems, such as heart disease, diabetes, sleeping problems,   joint problems, and some types of cancer. Unhealthy weight gain is often the result of making unhealthy food choices or not getting enough exercise. You can make changes to your lifestyle to prevent obesity and stay as healthy as possible. What nutrition changes can be made?   Eat only as much as your body needs. To do this: ? Pay attention to signs that you are hungry or full. Stop eating as soon as you feel full. ? If you feel hungry, try drinking water first before eating. Drink enough water so your urine is clear or pale yellow. ? Eat smaller portions. Pay attention to portion sizes when eating  out. ? Look at serving sizes on food labels. Most foods contain more than one serving per container. ? Eat the recommended number of calories for your gender and activity level. For most active people, a daily total of 2,000 calories is appropriate. If you are trying to lose weight or are not very active, you may need to eat fewer calories. Talk with your health care provider or a diet and nutrition specialist (dietitian) about how many calories you need each day.  Choose healthy foods, such as: ? Fruits and vegetables. At each meal, try to fill at least half of your plate with fruits and vegetables. ? Whole grains, such as whole-wheat bread, brown rice, and quinoa. ? Lean meats, such as chicken or fish. ? Other healthy proteins, such as beans, eggs, or tofu. ? Healthy fats, such as nuts, seeds, fatty fish, and olive oil. ? Low-fat or fat-free dairy products.  Check food labels, and avoid food and drinks that: ? Are high in calories. ? Have added sugar. ? Are high in sodium. ? Have saturated fats or trans fats.  Cook foods in healthier ways, such as by baking, broiling, or grilling.  Make a meal plan for the week, and shop with a grocery list to help you stay on track with your purchases. Try to avoid going to the grocery store when you are hungry.  When grocery shopping, try to shop around the outside of the store first, where the fresh foods are. Doing this helps you to avoid prepackaged foods, which can be high in sugar, salt (sodium), and fat. What lifestyle changes can be made?   Exercise for 30 or more minutes on 5 or more days each week. Exercising may include brisk walking, yard work, biking, running, swimming, and team sports like basketball and soccer. Ask your health care provider which exercises are safe for you.  Do muscle-strengthening activities, such as lifting weights or using resistance bands, on 2 or more days a week.  Do not use any products that contain nicotine or  tobacco, such as cigarettes and e-cigarettes. If you need help quitting, ask your health care provider.  Limit alcohol intake to no more than 1 drink a day for nonpregnant women and 2 drinks a day for men. One drink equals 12 oz of beer, 5 oz of wine, or 1 oz of hard liquor.  Try to get 7-9 hours of sleep each night. What other changes can be made?  Keep a food and activity journal to keep track of: ? What you ate and how many calories you had. Remember to count the calories in sauces, dressings, and side dishes. ? Whether you were active, and what exercises you did. ? Your calorie, weight, and activity goals.  Check your weight regularly. Track any changes. If you notice you have gained weight, make changes   to your diet or activity routine.  Avoid taking weight-loss medicines or supplements. Talk to your health care provider before starting any new medicine or supplement.  Talk to your health care provider before trying any new diet or exercise plan. Why are these changes important? Eating healthy, staying active, and having healthy habits can help you to prevent obesity. Those changes also:  Help you manage stress and emotions.  Help you connect with friends and family.  Improve your self-esteem.  Improve your sleep.  Prevent long-term health problems. What can happen if changes are not made? Being obese or overweight can cause you to develop joint or bone problems, which can make it hard for you to stay active or do activities you enjoy. Being obese or overweight also puts stress on your heart and lungs and can lead to health problems like diabetes, heart disease, and some cancers. Where to find more information Talk with your health care provider or a dietitian about healthy eating and healthy lifestyle choices. You may also find information from:  U.S. Department of Agriculture, MyPlate: www.choosemyplate.gov  American Heart Association: www.heart.org  Centers for Disease  Control and Prevention: www.cdc.gov Summary  Staying at a healthy weight is important to your overall health. It helps you to prevent certain diseases and health problems, such as heart disease, diabetes, joint problems, sleep disorders, and some types of cancer.  Being obese or overweight can cause you to develop joint or bone problems, which can make it hard for you to stay active or do activities you enjoy.  You can prevent unhealthy weight gain by eating a healthy diet, exercising regularly, not smoking, limiting alcohol, and getting enough sleep.  Talk with your health care provider or a dietitian for guidance about healthy eating and healthy lifestyle choices. This information is not intended to replace advice given to you by your health care provider. Make sure you discuss any questions you have with your health care provider. Document Released: 02/24/2016 Document Revised: 12/03/2016 Document Reviewed: 03/31/2016 Elsevier Interactive Patient Education  2019 Elsevier Inc.  

## 2018-04-07 NOTE — Assessment & Plan Note (Signed)
>>  ASSESSMENT AND PLAN FOR ADULT HYPOTHYROIDISM WRITTEN ON 04/07/2018  8:21 AM BY LADA, MELINDA P, MD  Referred to endo; having weight gain, change in stool, thin hair; recent adjustments and endo to manage going forward; status post radioactive iodine therapy

## 2018-04-07 NOTE — Assessment & Plan Note (Addendum)
Controlled today 

## 2018-04-07 NOTE — Assessment & Plan Note (Signed)
Working on weight loss, drink more water; see AVS

## 2018-04-07 NOTE — Assessment & Plan Note (Addendum)
Referred to endo; having weight gain, change in stool, thin hair; recent adjustments and endo to manage going forward; status post radioactive iodine therapy

## 2018-04-07 NOTE — Assessment & Plan Note (Signed)
May be related to thyroid dose being off; patiewnt wishes to wait until numbers are better, then rechecklipids in 3 months; encouraged decrease sat fats

## 2018-04-17 ENCOUNTER — Encounter: Payer: Self-pay | Admitting: *Deleted

## 2018-04-18 ENCOUNTER — Other Ambulatory Visit: Payer: Self-pay | Admitting: Family Medicine

## 2018-04-26 ENCOUNTER — Other Ambulatory Visit: Payer: Self-pay | Admitting: Family Medicine

## 2018-04-26 MED ORDER — TRAZODONE HCL 50 MG PO TABS: 25.0000 mg | ORAL_TABLET | Freq: Every evening | ORAL | 1 refills | Status: DC | PRN

## 2018-04-26 NOTE — Progress Notes (Signed)
Refill for trazodone, sent to [facility]

## 2018-05-08 ENCOUNTER — Other Ambulatory Visit: Payer: Self-pay | Admitting: Family Medicine

## 2018-05-08 DIAGNOSIS — I10 Essential (primary) hypertension: Secondary | ICD-10-CM

## 2018-05-08 DIAGNOSIS — Z0000 Encounter for general adult medical examination without abnormal findings: Secondary | ICD-10-CM

## 2018-05-09 NOTE — Telephone Encounter (Signed)
Left detailed VM. CRM created.  

## 2018-05-09 NOTE — Telephone Encounter (Signed)
Initially declined thinking too early then realized mail order However, she does need additional labs checked prior to more refills She also needs a physical and pap smear; she is not getting her yearly preventive care labs per our chart Ask her to come in for labs this week and schedule for physical in the next few weeks

## 2018-05-24 ENCOUNTER — Other Ambulatory Visit: Payer: Self-pay | Admitting: Family Medicine

## 2018-05-24 DIAGNOSIS — I10 Essential (primary) hypertension: Secondary | ICD-10-CM

## 2018-05-25 NOTE — Telephone Encounter (Signed)
Left detailed VM. CRM created.  

## 2018-05-25 NOTE — Telephone Encounter (Signed)
I called to remind patient of the need to get labs See 05/08/2018 phone note / refill note I'll refill her medicine this time, but she needs to be aware that I don't have a recent kidney function or potassium level and would appreciate if she would get those done She already has upcoming appt scheduled I tried home, work, and mobile, unable to reach her --------------------------------------- Joelene Millin, Please ask patient if she can please get her labs done soon I'm sending refills and we'll see her soon for her appointment in April too Thank you

## 2018-06-09 ENCOUNTER — Encounter: Payer: Managed Care, Other (non HMO) | Admitting: Family Medicine

## 2018-07-11 ENCOUNTER — Other Ambulatory Visit: Payer: Self-pay | Admitting: Family Medicine

## 2018-07-11 ENCOUNTER — Other Ambulatory Visit: Payer: Self-pay | Admitting: Nurse Practitioner

## 2018-07-11 MED ORDER — THYROID 30 MG PO TABS: 30.0000 mg | ORAL_TABLET | Freq: Every day | ORAL | 0 refills | Status: DC

## 2018-07-11 MED ORDER — THYROID 120 MG PO TABS: 120.0000 mg | ORAL_TABLET | Freq: Every day | ORAL | 0 refills | Status: DC

## 2018-07-11 NOTE — Telephone Encounter (Signed)
Dr. Sanda Klein referred patient to endocrinology specialist due to difficulty managing thyroid medicatoin, please have her follow-up with specialty for refills.

## 2018-07-11 NOTE — Telephone Encounter (Signed)
I contacted this patient to inform her of Elizabeth's message, and she stated that she is fresh out of medicine and is being put in a situation since she does not know when she can get back in sith Dr. Gabriel Carina.   She also stated that the pharmacist has tried to contact our office as well. Sr. Solum's # [phone] was given to her to as requested by the patient.

## 2018-07-11 NOTE — Telephone Encounter (Signed)
Copied from [place] [id number]. Topic: Quick Communication - Rx Refill/Question >> Jul 11, 2018 11:55 AM Kristina Peck wrote: Medication: thyroid (ARMOUR THYROID) 30 MG tablet [[id number]]   Has the patient contacted their pharmacy? Yes.   (Agent: If yes, when and what did the pharmacy advise?) The pharmacy stated that the patient needs a new prescription for this medication. The patient is completely out.   Preferred Pharmacy (with phone number or street name): [id number] - [name], [place] - [address] [phone] (Phone) [phone] (Fax)    Agent: Please be advised that RX refills may take up to 3 business days. We ask that you follow-up with your pharmacy.

## 2018-07-19 ENCOUNTER — Other Ambulatory Visit: Payer: Self-pay | Admitting: Family Medicine

## 2018-07-19 DIAGNOSIS — I10 Essential (primary) hypertension: Secondary | ICD-10-CM

## 2018-08-24 ENCOUNTER — Other Ambulatory Visit: Payer: Self-pay | Admitting: Family Medicine

## 2018-09-19 ENCOUNTER — Other Ambulatory Visit: Payer: Self-pay | Admitting: Family Medicine

## 2018-10-05 ENCOUNTER — Other Ambulatory Visit: Payer: Self-pay | Admitting: Family Medicine

## 2018-10-06 ENCOUNTER — Ambulatory Visit: Payer: Managed Care, Other (non HMO) | Admitting: Family Medicine

## 2018-10-06 NOTE — Telephone Encounter (Signed)
Due for physical in the next 3 months and routine in 03/2019

## 2018-10-12 ENCOUNTER — Other Ambulatory Visit: Payer: Self-pay | Admitting: Nurse Practitioner

## 2018-10-12 DIAGNOSIS — I10 Essential (primary) hypertension: Secondary | ICD-10-CM

## 2018-10-13 NOTE — Telephone Encounter (Signed)
Please schedule routine appt within the next months

## 2018-10-16 NOTE — Telephone Encounter (Signed)
LVM TO [facility] APPT IN THE NEXT FEW MONTHS

## 2018-10-17 ENCOUNTER — Encounter: Payer: Managed Care, Other (non HMO) | Admitting: Family Medicine

## 2018-12-04 ENCOUNTER — Other Ambulatory Visit: Payer: Self-pay

## 2019-02-14 ENCOUNTER — Other Ambulatory Visit: Payer: Self-pay | Admitting: Family

## 2019-02-14 DIAGNOSIS — Z1231 Encounter for screening mammogram for malignant neoplasm of breast: Secondary | ICD-10-CM

## 2019-03-07 ENCOUNTER — Ambulatory Visit
Admission: RE | Admit: 2019-03-07 | Discharge: 2019-03-07 | Disposition: A | Discharge status: Home / Self Care | Payer: Managed Care, Other (non HMO) | Source: Ambulatory Visit | Attending: Family | Admitting: Family

## 2019-03-07 DIAGNOSIS — Z1231 Encounter for screening mammogram for malignant neoplasm of breast: Secondary | ICD-10-CM | POA: Diagnosis present

## 2019-03-12 ENCOUNTER — Other Ambulatory Visit: Payer: Self-pay

## 2019-03-13 MED ORDER — TRAZODONE HCL 50 MG PO TABS: 25.0000 mg | ORAL_TABLET | Freq: Every evening | ORAL | 0 refills | Status: DC | PRN

## 2019-03-26 ENCOUNTER — Telehealth: Payer: Self-pay

## 2019-03-26 DIAGNOSIS — I10 Essential (primary) hypertension: Secondary | ICD-10-CM

## 2019-03-26 MED ORDER — LOSARTAN POTASSIUM 100 MG PO TABS: 100.0000 mg | ORAL_TABLET | Freq: Every day | ORAL | 0 refills | Status: DC

## 2019-03-26 MED ORDER — HYDROCHLOROTHIAZIDE 25 MG PO TABS: 25.0000 mg | ORAL_TABLET | Freq: Every day | ORAL | 0 refills | Status: DC

## 2019-03-26 NOTE — Telephone Encounter (Signed)
Pt needs appt for refills its been 1 yr since seen. 1 month refill given on bp meds, no more after that. Sent to local [facility]

## 2019-03-26 NOTE — Telephone Encounter (Signed)
lvm for scheduling °

## 2019-03-28 ENCOUNTER — Other Ambulatory Visit: Payer: Self-pay | Admitting: Family Medicine

## 2019-03-28 DIAGNOSIS — I10 Essential (primary) hypertension: Secondary | ICD-10-CM

## 2019-09-03 ENCOUNTER — Other Ambulatory Visit: Payer: Self-pay | Admitting: Orthopedic Surgery

## 2019-09-03 DIAGNOSIS — M25562 Pain in left knee: Secondary | ICD-10-CM

## 2019-09-12 ENCOUNTER — Telehealth: Payer: Self-pay

## 2019-09-12 NOTE — Telephone Encounter (Signed)
Patient will need f/u for refills

## 2019-09-14 ENCOUNTER — Telehealth: Payer: Self-pay

## 2019-09-14 NOTE — Telephone Encounter (Signed)
lvm for scheduling °

## 2019-09-14 NOTE — Telephone Encounter (Signed)
Pt needs appt for any refills 

## 2019-10-06 ENCOUNTER — Other Ambulatory Visit: Payer: Managed Care, Other (non HMO)

## 2020-03-21 ENCOUNTER — Other Ambulatory Visit: Payer: Self-pay | Admitting: Internal Medicine

## 2020-03-21 DIAGNOSIS — Z1231 Encounter for screening mammogram for malignant neoplasm of breast: Secondary | ICD-10-CM

## 2020-04-11 ENCOUNTER — Other Ambulatory Visit: Payer: Self-pay

## 2020-04-11 ENCOUNTER — Ambulatory Visit
Admission: RE | Admit: 2020-04-11 | Discharge: 2020-04-11 | Disposition: A | Discharge status: Home / Self Care | Payer: Managed Care, Other (non HMO) | Source: Ambulatory Visit | Attending: Internal Medicine | Admitting: Internal Medicine

## 2020-04-11 DIAGNOSIS — Z1231 Encounter for screening mammogram for malignant neoplasm of breast: Secondary | ICD-10-CM | POA: Diagnosis present

## 2020-06-04 ENCOUNTER — Ambulatory Visit: Payer: Managed Care, Other (non HMO) | Admitting: Plastic Surgery

## 2020-06-04 ENCOUNTER — Other Ambulatory Visit: Payer: Self-pay

## 2020-06-04 ENCOUNTER — Encounter: Payer: Self-pay | Admitting: Plastic Surgery

## 2020-06-04 VITALS — HR 94 | Ht 67.0 in | Wt 279.0 lb

## 2020-06-04 DIAGNOSIS — M4004 Postural kyphosis, thoracic region: Secondary | ICD-10-CM | POA: Diagnosis not present

## 2020-06-04 DIAGNOSIS — M546 Pain in thoracic spine: Secondary | ICD-10-CM | POA: Diagnosis not present

## 2020-06-04 DIAGNOSIS — N62 Hypertrophy of breast: Secondary | ICD-10-CM

## 2020-06-04 DIAGNOSIS — M545 Low back pain, unspecified: Secondary | ICD-10-CM | POA: Diagnosis not present

## 2020-06-04 NOTE — Progress Notes (Signed)
Referring Provider Perrin Maltese, MD [place],  [place] [id number]   CC:  Chief Complaint  Patient presents with  . Advice Only      Kristina Peck is an 60 y.o. female.  HPI: Patient presents to discuss breast reduction.  She has had years of back pain, neck pain and shoulder grooving related to her large breasts.  She tried over-the-counter medications, warm packs, cold packs and supportive garments with little relief.  She also gets rashes beneath her breast that have been refractory to over-the-counter treatments.  Her current cup size is a J and she wants to be a D.  She has had no family history of breast cancer and no prior breast biopsies or procedures.  She smokes 2 cigarettes a week and is not a diabetic.  Her last mammogram was a month ago and was negative.  Allergies  Allergen Reactions  . Lisinopril Hives    Outpatient Encounter Medications as of 06/04/2020  Medication Sig  . ARMOUR THYROID 180 MG tablet TAKE 1 TABLET BY MOUTH DAILY ON AN EMPTY STOMACH.  . busPIRone (BUSPAR) 15 MG tablet Take 15 mg by mouth 2 (two) times daily.  Marland Kitchen FLUoxetine (PROZAC) 20 MG tablet Take 1 tablet (20 mg total) by mouth daily. In addition to the 40 mg = 60 mg daily (Patient not taking: Reported on 06/04/2020)  . FLUoxetine (PROZAC) 40 MG capsule TAKE ONE CAPSULE BY MOUTH DAILY( STOP ESCITALOPRAM) (Patient not taking: Reported on 06/04/2020)  . hydrochlorothiazide (HYDRODIURIL) 25 MG tablet Take 1 tablet (25 mg total) by mouth daily.  Marland Kitchen LORazepam (ATIVAN) 0.5 MG tablet Take 0.5 mg by mouth daily as needed.  Marland Kitchen losartan (COZAAR) 100 MG tablet Take 1 tablet (100 mg total) by mouth daily.  . traZODone (DESYREL) 50 MG tablet Take 0.5-1 tablets (25-50 mg total) by mouth at bedtime as needed. for sleep  . Vitamin D, Ergocalciferol, (DRISDOL) 1.25 MG (50000 UNIT) CAPS capsule Take 50,000 Units by mouth once a week.  . [DISCONTINUED] thyroid (ARMOUR THYROID) 120 MG tablet Take 1 tablet (120 mg  total) by mouth daily before breakfast. Further refills from endocrinology (Patient not taking: Reported on 06/04/2020)  . [DISCONTINUED] thyroid (ARMOUR THYROID) 30 MG tablet Take 1 tablet (30 mg total) by mouth daily before breakfast. Total 150mg  daily per last ordered. Further refills from endocrinology (Patient not taking: Reported on 06/04/2020)   No facility-administered encounter medications on file as of 06/04/2020.     Past Medical History:  Diagnosis Date  . Anxiety   . Arthritis   . Breast hypertrophy in female 08/18/2016  . Hypertension   . Thyroid disease     Past Surgical History:  Procedure Laterality Date  . ABDOMINAL HYSTERECTOMY    . APPENDECTOMY    . COLON SURGERY    . KNEE ARTHROSCOPY Right 07/10/2015  . KNEE ARTHROSCOPY W/ MENISCAL REPAIR Right 05/13/2014  . MEDIAL PARTIAL KNEE REPLACEMENT Right [id number]  . TUBAL LIGATION Bilateral 06/09/2009   Bilateral salphingo-oophorectomy    Family History  Problem Relation Age of Onset  . Diabetes Mother   . Hypertension Mother   . Hypertension Father   . Aneurysm Father        aortic  . Hypertension Sister   . Heart attack Maternal Grandmother   . Heart failure Maternal Grandfather   . Heart attack Paternal Grandmother   . Cancer Paternal Grandfather        not sure  . Breast  cancer Neg Hx     Social History   Social History Narrative  . Not on file     Review of Systems General: Denies fevers, chills, weight loss CV: Denies chest pain, shortness of breath, palpitations  Physical Exam Vitals with BMI 06/04/2020 04/07/2018 11/14/2017  Height 5\' 7"  5\' 6"  -  Weight 279 lbs 264 lbs 10 oz -  BMI 10.17 51.02 -  Systolic (No Data) 585 -  Diastolic (No Data) 72 -  Pulse 94 99 87    General:  No acute distress,  Alert and oriented, Non-Toxic, Normal speech and affect Breast: She has grade 3 ptosis.  Sternal notch to nipple is 37 cm bilaterally.  Nipple to fold is 24 cm on the right and 23 cm on the left.  I do not  see any obvious scars or masses.  Assessment/Plan The patient has bilateral symptomatic macromastia.  She is a good candidate for a breast reduction.  She is interested in pursuing surgical treatment.  She has tried supportive garments and fitted bras with no relief.  The details of breast reduction surgery were discussed.  I explained the procedure in detail along the with the expected scars.  The risks were discussed in detail and include bleeding, infection, damage to surrounding structures, need for additional procedures, nipple loss, change in nipple sensation, persistent pain, contour irregularities and asymmetries.  I explained that breast feeding is often not possible after breast reduction surgery.  We discussed the expected postoperative course with an overall recovery period of about 1 month.  She demonstrated full understanding of all risks.  We discussed her personal risk factors that include tobacco use.  She does not smoke every day and says that she can quit no problem.  I explained the risk of tobacco smoking on wound healing and infection after breast reduction and she seems to understand.  I anticipate approximately 1200g of tissue removed from each side.   [name] 06/04/2020, 6:34 PM

## 2020-09-02 ENCOUNTER — Other Ambulatory Visit: Payer: Self-pay | Admitting: Surgical

## 2020-09-02 DIAGNOSIS — N62 Hypertrophy of breast: Secondary | ICD-10-CM

## 2020-09-18 ENCOUNTER — Other Ambulatory Visit: Payer: Self-pay

## 2020-09-18 ENCOUNTER — Ambulatory Visit (INDEPENDENT_AMBULATORY_CARE_PROVIDER_SITE_OTHER): Payer: Managed Care, Other (non HMO)

## 2020-09-18 VITALS — BP 179/117 | HR 73 | Ht 67.0 in | Wt 283.8 lb

## 2020-09-18 DIAGNOSIS — N62 Hypertrophy of breast: Secondary | ICD-10-CM

## 2020-09-18 LAB — NICOTINE/COTININE METABOLITES
Cotinine: <1 ng/mL
Nicotine: <1 ng/mL

## 2020-09-19 ENCOUNTER — Other Ambulatory Visit: Payer: Self-pay

## 2020-09-19 ENCOUNTER — Encounter (HOSPITAL_BASED_OUTPATIENT_CLINIC_OR_DEPARTMENT_OTHER): Payer: Self-pay | Admitting: Plastic Surgery

## 2020-09-24 ENCOUNTER — Encounter (HOSPITAL_BASED_OUTPATIENT_CLINIC_OR_DEPARTMENT_OTHER)
Admission: RE | Admit: 2020-09-24 | Discharge: 2020-09-24 | Disposition: A | Discharge status: Home / Self Care | Payer: Managed Care, Other (non HMO) | Source: Ambulatory Visit | Attending: Plastic Surgery | Admitting: Plastic Surgery

## 2020-09-24 ENCOUNTER — Other Ambulatory Visit (HOSPITAL_COMMUNITY): Payer: Managed Care, Other (non HMO)

## 2020-09-24 DIAGNOSIS — Z01818 Encounter for other preprocedural examination: Secondary | ICD-10-CM | POA: Diagnosis present

## 2020-09-24 DIAGNOSIS — I10 Essential (primary) hypertension: Secondary | ICD-10-CM | POA: Diagnosis not present

## 2020-09-24 LAB — BASIC METABOLIC PANEL
Anion gap: 9 (ref 5–15)
BUN: 7 mg/dL (ref 6–20)
CO2: 27 mmol/L (ref 22–32)
Calcium: 8.8 mg/dL — ABNORMAL LOW (ref 8.9–10.3)
Chloride: 99 mmol/L (ref 98–111)
Creatinine, Ser: 0.88 mg/dL (ref 0.44–1.00)
GFR, Estimated: >60 mL/min (ref >60)
Glucose, Bld: 84 mg/dL (ref 70–99)
Potassium: 3.7 mmol/L (ref 3.5–5.1)
Sodium: 135 mmol/L (ref 135–145)

## 2020-09-24 NOTE — Progress Notes (Signed)
Surgical soap given to patient, instructions given, patient verbalized understanding.

## 2020-09-26 ENCOUNTER — Ambulatory Visit (HOSPITAL_BASED_OUTPATIENT_CLINIC_OR_DEPARTMENT_OTHER): Payer: Managed Care, Other (non HMO) | Admitting: Certified Registered"

## 2020-09-26 ENCOUNTER — Ambulatory Visit (HOSPITAL_BASED_OUTPATIENT_CLINIC_OR_DEPARTMENT_OTHER): Admit: 2020-09-26 | Payer: Managed Care, Other (non HMO) | Admitting: Plastic Surgery

## 2020-09-26 ENCOUNTER — Encounter (HOSPITAL_BASED_OUTPATIENT_CLINIC_OR_DEPARTMENT_OTHER)
Admission: RE | Disposition: A | Discharge status: Home / Self Care | Payer: Self-pay | Source: Home / Self Care | Attending: Plastic Surgery

## 2020-09-26 ENCOUNTER — Ambulatory Visit (HOSPITAL_BASED_OUTPATIENT_CLINIC_OR_DEPARTMENT_OTHER): Payer: Managed Care, Other (non HMO) | Admitting: Anesthesiology

## 2020-09-26 ENCOUNTER — Observation Stay (HOSPITAL_BASED_OUTPATIENT_CLINIC_OR_DEPARTMENT_OTHER)
Admission: RE | Admit: 2020-09-26 | Discharge: 2020-09-27 | Disposition: A | Discharge status: Home / Self Care | Payer: Managed Care, Other (non HMO) | Attending: Plastic Surgery | Admitting: Plastic Surgery

## 2020-09-26 ENCOUNTER — Encounter (HOSPITAL_BASED_OUTPATIENT_CLINIC_OR_DEPARTMENT_OTHER): Payer: Self-pay | Admitting: Plastic Surgery

## 2020-09-26 ENCOUNTER — Other Ambulatory Visit: Payer: Self-pay

## 2020-09-26 DIAGNOSIS — I10 Essential (primary) hypertension: Secondary | ICD-10-CM | POA: Insufficient documentation

## 2020-09-26 DIAGNOSIS — L7622 Postprocedural hemorrhage and hematoma of skin and subcutaneous tissue following other procedure: Secondary | ICD-10-CM

## 2020-09-26 DIAGNOSIS — N6022 Fibroadenosis of left breast: Secondary | ICD-10-CM | POA: Insufficient documentation

## 2020-09-26 DIAGNOSIS — N62 Hypertrophy of breast: Principal | ICD-10-CM | POA: Diagnosis present

## 2020-09-26 DIAGNOSIS — Z87891 Personal history of nicotine dependence: Secondary | ICD-10-CM | POA: Diagnosis not present

## 2020-09-26 DIAGNOSIS — M545 Low back pain, unspecified: Secondary | ICD-10-CM

## 2020-09-26 DIAGNOSIS — M4004 Postural kyphosis, thoracic region: Secondary | ICD-10-CM

## 2020-09-26 DIAGNOSIS — M546 Pain in thoracic spine: Secondary | ICD-10-CM

## 2020-09-26 DIAGNOSIS — E039 Hypothyroidism, unspecified: Secondary | ICD-10-CM | POA: Insufficient documentation

## 2020-09-26 HISTORY — DX: Hypothyroidism, unspecified: E03.9

## 2020-09-26 HISTORY — DX: Angina pectoris, unspecified: I20.9

## 2020-09-26 HISTORY — PX: HEMATOMA EVACUATION: SHX5118

## 2020-09-26 HISTORY — DX: Depression, unspecified: F32.A

## 2020-09-26 HISTORY — PX: BREAST REDUCTION SURGERY: SHX8

## 2020-09-26 HISTORY — DX: Hypertrophy of breast: N62

## 2020-09-26 LAB — POCT HEMOGLOBIN-HEMACUE: Hemoglobin: 13.2 g/dL (ref 12.0–15.0)

## 2020-09-26 SURGERY — EVACUATION HEMATOMA
Anesthesia: General | Site: Breast | Laterality: Left

## 2020-09-26 SURGERY — MAMMOPLASTY, REDUCTION
Anesthesia: General | Site: Breast | Laterality: Bilateral

## 2020-09-26 MED ORDER — 0.9 % SODIUM CHLORIDE (POUR BTL) OPTIME
TOPICAL | Status: DC | PRN
  Administered 2020-09-26: 1000 mL

## 2020-09-26 MED ORDER — LABETALOL HCL 5 MG/ML IV SOLN
10.0000 mg | Freq: Once | INTRAVENOUS | Status: AC
  Administered 2020-09-26: 10 mg via INTRAVENOUS

## 2020-09-26 MED ORDER — CEFAZOLIN IN SODIUM CHLORIDE 3-0.9 GM/100ML-% IV SOLN
3.0000 g | INTRAVENOUS | Status: AC
  Administered 2020-09-26: 3 g via INTRAVENOUS

## 2020-09-26 MED ORDER — CELECOXIB 200 MG PO CAPS: 200.0000 mg | ORAL_CAPSULE | Freq: Once | ORAL | Status: DC

## 2020-09-26 MED ORDER — ACETAMINOPHEN 325 MG RE SUPP: 650.0000 mg | Freq: Four times a day (QID) | RECTAL | Status: DC | PRN

## 2020-09-26 MED ORDER — CEFAZOLIN IN SODIUM CHLORIDE 3-0.9 GM/100ML-% IV SOLN
INTRAVENOUS | Status: AC
  Filled 2020-09-26: qty 100

## 2020-09-26 MED ORDER — CEFAZOLIN IN SODIUM CHLORIDE 3-0.9 GM/100ML-% IV SOLN: 3.0000 g | INTRAVENOUS | Status: DC

## 2020-09-26 MED ORDER — PROPOFOL 10 MG/ML IV BOLUS
INTRAVENOUS | Status: DC | PRN
  Administered 2020-09-26: 200 mg via INTRAVENOUS

## 2020-09-26 MED ORDER — PHENYLEPHRINE 40 MCG/ML (10ML) SYRINGE FOR IV PUSH (FOR BLOOD PRESSURE SUPPORT)
PREFILLED_SYRINGE | INTRAVENOUS | Status: AC
  Filled 2020-09-26: qty 20

## 2020-09-26 MED ORDER — KETOROLAC TROMETHAMINE 30 MG/ML IJ SOLN
30.0000 mg | Freq: Once | INTRAMUSCULAR | Status: AC
  Administered 2020-09-26: 30 mg via INTRAVENOUS

## 2020-09-26 MED ORDER — FENTANYL CITRATE (PF) 100 MCG/2ML IJ SOLN
INTRAMUSCULAR | Status: DC | PRN
  Administered 2020-09-26 (×3): 50 ug via INTRAVENOUS
  Administered 2020-09-26: 100 ug via INTRAVENOUS
  Administered 2020-09-26: 50 ug via INTRAVENOUS

## 2020-09-26 MED ORDER — PROCHLORPERAZINE MALEATE 10 MG PO TABS
10.0000 mg | ORAL_TABLET | Freq: Four times a day (QID) | ORAL | Status: DC | PRN
  Filled 2020-09-26: qty 1

## 2020-09-26 MED ORDER — DEXAMETHASONE SODIUM PHOSPHATE 4 MG/ML IJ SOLN
INTRAMUSCULAR | Status: DC | PRN
  Administered 2020-09-26: 4 mg via INTRAVENOUS

## 2020-09-26 MED ORDER — EPHEDRINE SULFATE 50 MG/ML IJ SOLN
INTRAMUSCULAR | Status: DC | PRN
  Administered 2020-09-26 (×3): 10 mg via INTRAVENOUS

## 2020-09-26 MED ORDER — LACTATED RINGERS IV SOLN
INTRAVENOUS | Status: DC | PRN
  Administered 2020-09-26 (×2): 1000 mL

## 2020-09-26 MED ORDER — PROCHLORPERAZINE EDISYLATE 10 MG/2ML IJ SOLN: 5.0000 mg | Freq: Four times a day (QID) | INTRAMUSCULAR | Status: DC | PRN

## 2020-09-26 MED ORDER — TRANEXAMIC ACID-NACL 1000-0.7 MG/100ML-% IV SOLN
INTRAVENOUS | Status: DC | PRN
  Administered 2020-09-26: 1000 mg via INTRAVENOUS

## 2020-09-26 MED ORDER — ACETAMINOPHEN 500 MG PO TABS
1000.0000 mg | ORAL_TABLET | Freq: Once | ORAL | Status: AC
  Administered 2020-09-26: 1000 mg via ORAL

## 2020-09-26 MED ORDER — ACETAMINOPHEN 500 MG PO TABS
ORAL_TABLET | ORAL | Status: AC
  Filled 2020-09-26: qty 2

## 2020-09-26 MED ORDER — ONDANSETRON HCL 4 MG/2ML IJ SOLN
INTRAMUSCULAR | Status: AC
  Filled 2020-09-26: qty 12

## 2020-09-26 MED ORDER — OXYCODONE HCL 5 MG PO TABS
5.0000 mg | ORAL_TABLET | Freq: Once | ORAL | Status: AC
  Administered 2020-09-26: 5 mg via ORAL

## 2020-09-26 MED ORDER — LIDOCAINE HCL (PF) 2 % IJ SOLN
INTRAMUSCULAR | Status: AC
  Filled 2020-09-26: qty 20

## 2020-09-26 MED ORDER — PROPOFOL 500 MG/50ML IV EMUL
INTRAVENOUS | Status: DC | PRN
  Administered 2020-09-26: 25 ug/kg/min via INTRAVENOUS

## 2020-09-26 MED ORDER — FENTANYL CITRATE (PF) 100 MCG/2ML IJ SOLN
INTRAMUSCULAR | Status: AC
  Filled 2020-09-26: qty 2

## 2020-09-26 MED ORDER — ONDANSETRON HCL 4 MG/2ML IJ SOLN
INTRAMUSCULAR | Status: DC | PRN
  Administered 2020-09-26: 4 mg via INTRAVENOUS

## 2020-09-26 MED ORDER — MIDAZOLAM HCL 5 MG/5ML IJ SOLN
INTRAMUSCULAR | Status: DC | PRN
  Administered 2020-09-26: 2 mg via INTRAVENOUS

## 2020-09-26 MED ORDER — HYDROCODONE-ACETAMINOPHEN 5-325 MG PO TABS
1.0000 | ORAL_TABLET | ORAL | Status: DC | PRN
  Administered 2020-09-26 – 2020-09-27 (×3): 1 via ORAL
  Filled 2020-09-26 (×3): qty 1

## 2020-09-26 MED ORDER — CHLORHEXIDINE GLUCONATE CLOTH 2 % EX PADS: 6.0000 | MEDICATED_PAD | Freq: Once | CUTANEOUS | Status: DC

## 2020-09-26 MED ORDER — LORAZEPAM 1 MG PO TABS
0.5000 mg | ORAL_TABLET | Freq: Every day | ORAL | Status: DC | PRN
  Administered 2020-09-26: 0.5 mg via ORAL
  Filled 2020-09-26: qty 1

## 2020-09-26 MED ORDER — BUSPIRONE HCL 15 MG PO TABS
15.0000 mg | ORAL_TABLET | Freq: Two times a day (BID) | ORAL | Status: DC
  Administered 2020-09-26: 15 mg via ORAL
  Filled 2020-09-26: qty 1

## 2020-09-26 MED ORDER — ROCURONIUM BROMIDE 100 MG/10ML IV SOLN
INTRAVENOUS | Status: DC | PRN
  Administered 2020-09-26: 60 mg via INTRAVENOUS

## 2020-09-26 MED ORDER — EPINEPHRINE PF 1 MG/ML IJ SOLN
INTRAMUSCULAR | Status: AC
  Filled 2020-09-26: qty 2

## 2020-09-26 MED ORDER — ONDANSETRON HCL 4 MG/2ML IJ SOLN: 4.0000 mg | Freq: Four times a day (QID) | INTRAMUSCULAR | Status: DC | PRN

## 2020-09-26 MED ORDER — DEXAMETHASONE SODIUM PHOSPHATE 10 MG/ML IJ SOLN
INTRAMUSCULAR | Status: AC
  Filled 2020-09-26: qty 3

## 2020-09-26 MED ORDER — EPHEDRINE 5 MG/ML INJ
INTRAVENOUS | Status: AC
  Filled 2020-09-26: qty 30

## 2020-09-26 MED ORDER — KETOROLAC TROMETHAMINE 30 MG/ML IJ SOLN
INTRAMUSCULAR | Status: AC
  Filled 2020-09-26: qty 1

## 2020-09-26 MED ORDER — FENTANYL CITRATE (PF) 100 MCG/2ML IJ SOLN
25.0000 ug | INTRAMUSCULAR | Status: DC | PRN
  Administered 2020-09-26: 50 ug via INTRAVENOUS
  Administered 2020-09-26: 25 ug via INTRAVENOUS

## 2020-09-26 MED ORDER — ONDANSETRON HCL 4 MG PO TABS: 4.0000 mg | ORAL_TABLET | Freq: Three times a day (TID) | ORAL | 0 refills | Status: AC | PRN

## 2020-09-26 MED ORDER — SUGAMMADEX SODIUM 200 MG/2ML IV SOLN
INTRAVENOUS | Status: DC | PRN
  Administered 2020-09-26: 200 mg via INTRAVENOUS

## 2020-09-26 MED ORDER — ACETAMINOPHEN 325 MG PO TABS
650.0000 mg | ORAL_TABLET | Freq: Four times a day (QID) | ORAL | Status: DC | PRN
  Administered 2020-09-27: 650 mg via ORAL
  Filled 2020-09-26: qty 2

## 2020-09-26 MED ORDER — BUPIVACAINE HCL (PF) 0.25 % IJ SOLN
INTRAMUSCULAR | Status: AC
  Filled 2020-09-26: qty 60

## 2020-09-26 MED ORDER — TRANEXAMIC ACID 650 MG PO TABS
ORAL_TABLET | ORAL | Status: AC
  Filled 2020-09-26: qty 1

## 2020-09-26 MED ORDER — MIDAZOLAM HCL 2 MG/2ML IJ SOLN
INTRAMUSCULAR | Status: AC
  Filled 2020-09-26: qty 2

## 2020-09-26 MED ORDER — HYDROCHLOROTHIAZIDE 25 MG PO TABS
25.0000 mg | ORAL_TABLET | Freq: Every day | ORAL | Status: DC
  Filled 2020-09-26: qty 1

## 2020-09-26 MED ORDER — AMISULPRIDE (ANTIEMETIC) 5 MG/2ML IV SOLN
INTRAVENOUS | Status: AC
  Filled 2020-09-26: qty 4

## 2020-09-26 MED ORDER — LABETALOL HCL 5 MG/ML IV SOLN
INTRAVENOUS | Status: AC
  Filled 2020-09-26: qty 4

## 2020-09-26 MED ORDER — OXYCODONE HCL 5 MG PO TABS
ORAL_TABLET | ORAL | Status: AC
  Filled 2020-09-26: qty 1

## 2020-09-26 MED ORDER — THYROID 60 MG PO TABS
180.0000 mg | ORAL_TABLET | Freq: Every day | ORAL | Status: DC
  Filled 2020-09-26: qty 1

## 2020-09-26 MED ORDER — AMISULPRIDE (ANTIEMETIC) 5 MG/2ML IV SOLN
10.0000 mg | Freq: Once | INTRAVENOUS | Status: AC | PRN
  Administered 2020-09-26: 10 mg via INTRAVENOUS

## 2020-09-26 MED ORDER — FENTANYL CITRATE (PF) 100 MCG/2ML IJ SOLN
INTRAMUSCULAR | Status: DC | PRN
  Administered 2020-09-26: 50 ug via INTRAVENOUS

## 2020-09-26 MED ORDER — DEXAMETHASONE SODIUM PHOSPHATE 4 MG/ML IJ SOLN
INTRAMUSCULAR | Status: DC | PRN
  Administered 2020-09-26: 5 mg via INTRAVENOUS

## 2020-09-26 MED ORDER — EPHEDRINE SULFATE 50 MG/ML IJ SOLN
INTRAMUSCULAR | Status: DC | PRN
  Administered 2020-09-26: 10 mg via INTRAVENOUS

## 2020-09-26 MED ORDER — SUGAMMADEX SODIUM 500 MG/5ML IV SOLN
INTRAVENOUS | Status: AC
  Filled 2020-09-26: qty 5

## 2020-09-26 MED ORDER — MORPHINE SULFATE (PF) 4 MG/ML IV SOLN: 1.0000 mg | INTRAVENOUS | Status: DC | PRN

## 2020-09-26 MED ORDER — ONDANSETRON 4 MG PO TBDP: 4.0000 mg | ORAL_TABLET | Freq: Four times a day (QID) | ORAL | Status: DC | PRN

## 2020-09-26 MED ORDER — LIDOCAINE 2% (20 MG/ML) 5 ML SYRINGE
INTRAMUSCULAR | Status: DC | PRN
  Administered 2020-09-26: 60 mg via INTRAVENOUS

## 2020-09-26 MED ORDER — TRANEXAMIC ACID-NACL 1000-0.7 MG/100ML-% IV SOLN
INTRAVENOUS | Status: AC
  Filled 2020-09-26: qty 100

## 2020-09-26 MED ORDER — LOSARTAN POTASSIUM 50 MG PO TABS
100.0000 mg | ORAL_TABLET | Freq: Every day | ORAL | Status: DC
  Filled 2020-09-26: qty 2

## 2020-09-26 MED ORDER — LIDOCAINE HCL (CARDIAC) PF 100 MG/5ML IV SOSY
PREFILLED_SYRINGE | INTRAVENOUS | Status: DC | PRN
  Administered 2020-09-26: 60 mg via INTRAVENOUS

## 2020-09-26 MED ORDER — LACTATED RINGERS IV SOLN: INTRAVENOUS | Status: DC

## 2020-09-26 MED ORDER — HYDROCODONE-ACETAMINOPHEN 5-325 MG PO TABS: 1.0000 | ORAL_TABLET | Freq: Four times a day (QID) | ORAL | 0 refills | Status: DC | PRN

## 2020-09-26 SURGICAL SUPPLY — 72 items
APL PRP STRL LF DISP 70% ISPRP (MISCELLANEOUS) ×2
APL SKNCLS STERI-STRIP NONHPOA (GAUZE/BANDAGES/DRESSINGS) ×2
BAG DECANTER FOR FLEXI CONT (MISCELLANEOUS) IMPLANT
BENZOIN TINCTURE PRP APPL 2/3 (GAUZE/BANDAGES/DRESSINGS) ×4 IMPLANT
BLADE SURG 10 STRL SS (BLADE) ×4 IMPLANT
BLADE SURG 15 STRL LF DISP TIS (BLADE) IMPLANT
BLADE SURG 15 STRL SS (BLADE)
BNDG CMPR MED 10X6 ELC LF (GAUZE/BANDAGES/DRESSINGS) ×1
BNDG ELASTIC 6X10 VLCR STRL LF (GAUZE/BANDAGES/DRESSINGS) ×2 IMPLANT
CANISTER SUCT 1200ML W/VALVE (MISCELLANEOUS) ×2 IMPLANT
CHLORAPREP W/TINT 26 (MISCELLANEOUS) ×4 IMPLANT
CLIP VESOCCLUDE MED 6/CT (CLIP) IMPLANT
COVER BACK TABLE 60X90IN (DRAPES) ×2 IMPLANT
COVER MAYO STAND STRL (DRAPES) ×2 IMPLANT
DECANTER SPIKE VIAL GLASS SM (MISCELLANEOUS) IMPLANT
DRAIN CHANNEL 15F RND FF W/TCR (WOUND CARE) IMPLANT
DRAIN PENROSE 1/2X12 LTX STRL (WOUND CARE) ×1 IMPLANT
DRAPE LAPAROSCOPIC ABDOMINAL (DRAPES) ×2 IMPLANT
DRAPE UTILITY XL STRL (DRAPES) ×2 IMPLANT
DRSG PAD ABDOMINAL 8X10 ST (GAUZE/BANDAGES/DRESSINGS) ×8 IMPLANT
ELECT REM PT RETURN 9FT ADLT (ELECTROSURGICAL) ×2
ELECTRODE REM PT RTRN 9FT ADLT (ELECTROSURGICAL) ×1 IMPLANT
EVACUATOR SILICONE 100CC (DRAIN) IMPLANT
GAUZE SPONGE 4X4 12PLY STRL (GAUZE/BANDAGES/DRESSINGS) ×4 IMPLANT
GAUZE XEROFORM 5X9 LF (GAUZE/BANDAGES/DRESSINGS) IMPLANT
GLOVE SRG 8 PF TXTR STRL LF DI (GLOVE) IMPLANT
GLOVE SURG ENC MOIS LTX SZ6.5 (GLOVE) ×1 IMPLANT
GLOVE SURG ENC MOIS LTX SZ7.5 (GLOVE) IMPLANT
GLOVE SURG ENC TEXT LTX SZ7.5 (GLOVE) ×2 IMPLANT
GLOVE SURG LTX SZ6.5 (GLOVE) IMPLANT
GLOVE SURG POLYISO LF SZ8 (GLOVE) ×2 IMPLANT
GLOVE SURG UNDER POLY LF SZ7 (GLOVE) ×1 IMPLANT
GLOVE SURG UNDER POLY LF SZ8 (GLOVE)
GOWN STRL REUS W/ TWL LRG LVL3 (GOWN DISPOSABLE) ×3 IMPLANT
GOWN STRL REUS W/TWL 2XL LVL3 (GOWN DISPOSABLE) ×1 IMPLANT
GOWN STRL REUS W/TWL LRG LVL3 (GOWN DISPOSABLE) ×4
MARKER SKIN DUAL TIP RULER LAB (MISCELLANEOUS) IMPLANT
NDL SAFETY ECLIPSE 18X1.5 (NEEDLE) ×1 IMPLANT
NDL SPNL 18GX3.5 QUINCKE PK (NEEDLE) ×1 IMPLANT
NEEDLE FILTER BLUNT 18X 1/2SAF (NEEDLE) ×1
NEEDLE FILTER BLUNT 18X1 1/2 (NEEDLE) ×1 IMPLANT
NEEDLE HYPO 18GX1.5 SHARP (NEEDLE) ×2
NEEDLE HYPO 25X1 1.5 SAFETY (NEEDLE) IMPLANT
NEEDLE SPNL 18GX3.5 QUINCKE PK (NEEDLE) ×2 IMPLANT
NS IRRIG 1000ML POUR BTL (IV SOLUTION) ×2 IMPLANT
PACK BASIN DAY SURGERY FS (CUSTOM PROCEDURE TRAY) ×2 IMPLANT
PENCIL SMOKE EVACUATOR (MISCELLANEOUS) ×2 IMPLANT
PIN SAFETY STERILE (MISCELLANEOUS) IMPLANT
SHEET MEDIUM DRAPE 40X70 STRL (DRAPES) ×1 IMPLANT
SLEEVE SCD COMPRESS KNEE MED (STOCKING) ×2 IMPLANT
SPONGE T-LAP 18X18 ~~LOC~~+RFID (SPONGE) ×7 IMPLANT
STAPLER INSORB 30 2030 C-SECTI (MISCELLANEOUS) ×3 IMPLANT
STAPLER VISISTAT 35W (STAPLE) ×2 IMPLANT
STRIP SUTURE WOUND CLOSURE 1/2 (MISCELLANEOUS) ×7 IMPLANT
SUT CHROMIC 4 0 PS 2 18 (SUTURE) IMPLANT
SUT ETHILON 2 0 FS 18 (SUTURE) IMPLANT
SUT ETHILON 3 0 PS 1 (SUTURE) IMPLANT
SUT MNCRL AB 4-0 PS2 18 (SUTURE) ×6 IMPLANT
SUT PDS 3-0 CT2 (SUTURE) ×8
SUT PDS II 3-0 CT2 27 ABS (SUTURE) ×2 IMPLANT
SUT VIC AB 3-0 PS1 18 (SUTURE)
SUT VIC AB 3-0 PS1 18XBRD (SUTURE) IMPLANT
SUT VLOC 180 P-14 24 (SUTURE) ×4 IMPLANT
SYR 50ML LL SCALE MARK (SYRINGE) ×1 IMPLANT
SYR BULB IRRIG 60ML STRL (SYRINGE) ×2 IMPLANT
SYR CONTROL 10ML LL (SYRINGE) IMPLANT
TAPE MEASURE VINYL STERILE (MISCELLANEOUS) IMPLANT
TOWEL GREEN STERILE FF (TOWEL DISPOSABLE) ×5 IMPLANT
TUBE CONNECTING 20X1/4 (TUBING) ×2 IMPLANT
TUBING INFILTRATION IT-10001 (TUBING) ×2 IMPLANT
UNDERPAD 30X36 HEAVY ABSORB (UNDERPADS AND DIAPERS) ×4 IMPLANT
YANKAUER SUCT BULB TIP NO VENT (SUCTIONS) ×2 IMPLANT

## 2020-09-26 SURGICAL SUPPLY — 86 items
ADH SKN CLS APL DERMABOND .7 (GAUZE/BANDAGES/DRESSINGS)
APL PRP STRL LF DISP 70% ISPRP (MISCELLANEOUS) ×2
APL SKNCLS STERI-STRIP NONHPOA (GAUZE/BANDAGES/DRESSINGS) ×1
BAND INSRT 18 STRL LF DISP RB (MISCELLANEOUS)
BAND RUBBER #18 3X1/16 STRL (MISCELLANEOUS) IMPLANT
BENZOIN TINCTURE PRP APPL 2/3 (GAUZE/BANDAGES/DRESSINGS) ×1 IMPLANT
BLADE CLIPPER SURG (BLADE) IMPLANT
BLADE SURG 15 STRL LF DISP TIS (BLADE) ×1 IMPLANT
BLADE SURG 15 STRL SS (BLADE) ×2
CANISTER SUCT 1200ML W/VALVE (MISCELLANEOUS) ×1 IMPLANT
CHLORAPREP W/TINT 26 (MISCELLANEOUS) ×4 IMPLANT
COVER BACK TABLE 60X90IN (DRAPES) ×2 IMPLANT
COVER MAYO STAND STRL (DRAPES) ×2 IMPLANT
DERMABOND ADVANCED (GAUZE/BANDAGES/DRESSINGS)
DERMABOND ADVANCED .7 DNX12 (GAUZE/BANDAGES/DRESSINGS) IMPLANT
DRAIN CHANNEL 19F RND (DRAIN) ×2 IMPLANT
DRAIN JP 10F RND SILICONE (MISCELLANEOUS) IMPLANT
DRAPE LAPAROSCOPIC ABDOMINAL (DRAPES) ×1 IMPLANT
DRAPE LAPAROTOMY 100X72 PEDS (DRAPES) IMPLANT
DRAPE U-SHAPE 76X120 STRL (DRAPES) IMPLANT
DRAPE UTILITY XL STRL (DRAPES) ×2 IMPLANT
DRSG TELFA 3X8 NADH (GAUZE/BANDAGES/DRESSINGS) IMPLANT
ELECT COATED BLADE 2.86 ST (ELECTRODE) IMPLANT
ELECT NDL BLADE 2-5/6 (NEEDLE) ×1 IMPLANT
ELECT NEEDLE BLADE 2-5/6 (NEEDLE) IMPLANT
ELECT REM PT RETURN 9FT ADLT (ELECTROSURGICAL) ×2
ELECT REM PT RETURN 9FT PED (ELECTROSURGICAL)
ELECTRODE REM PT RETRN 9FT PED (ELECTROSURGICAL) IMPLANT
ELECTRODE REM PT RTRN 9FT ADLT (ELECTROSURGICAL) IMPLANT
EVACUATOR SILICONE 100CC (DRAIN) ×1 IMPLANT
GAUZE SPONGE 4X4 12PLY STRL LF (GAUZE/BANDAGES/DRESSINGS) ×4 IMPLANT
GAUZE XEROFORM 1X8 LF (GAUZE/BANDAGES/DRESSINGS) IMPLANT
GLOVE SRG 8 PF TXTR STRL LF DI (GLOVE) IMPLANT
GLOVE SURG ENC TEXT LTX SZ7.5 (GLOVE) ×2 IMPLANT
GLOVE SURG POLYISO LF SZ7 (GLOVE) ×2 IMPLANT
GLOVE SURG POLYISO LF SZ8 (GLOVE) ×1 IMPLANT
GLOVE SURG UNDER POLY LF SZ7 (GLOVE) ×1 IMPLANT
GLOVE SURG UNDER POLY LF SZ8 (GLOVE)
GOWN STRL REUS W/ TWL LRG LVL3 (GOWN DISPOSABLE) ×2 IMPLANT
GOWN STRL REUS W/TWL 2XL LVL3 (GOWN DISPOSABLE) ×1 IMPLANT
GOWN STRL REUS W/TWL LRG LVL3 (GOWN DISPOSABLE) ×4
NDL FILTER BLUNT 18X1 1/2 (NEEDLE) IMPLANT
NDL HYPO 30GX1 BEV (NEEDLE) IMPLANT
NDL PRECISIONGLIDE 27X1.5 (NEEDLE) ×1 IMPLANT
NDL SAFETY ECLIPSE 18X1.5 (NEEDLE) IMPLANT
NEEDLE FILTER BLUNT 18X 1/2SAF (NEEDLE)
NEEDLE FILTER BLUNT 18X1 1/2 (NEEDLE) IMPLANT
NEEDLE HYPO 18GX1.5 SHARP (NEEDLE)
NEEDLE HYPO 30GX1 BEV (NEEDLE) IMPLANT
NEEDLE PRECISIONGLIDE 27X1.5 (NEEDLE) IMPLANT
NS IRRIG 1000ML POUR BTL (IV SOLUTION) ×2 IMPLANT
PACK BASIN DAY SURGERY FS (CUSTOM PROCEDURE TRAY) ×2 IMPLANT
PAD DRESSING TELFA 3X8 NADH (GAUZE/BANDAGES/DRESSINGS) IMPLANT
PENCIL SMOKE EVACUATOR (MISCELLANEOUS) ×2 IMPLANT
PIN SAFETY STERILE (MISCELLANEOUS) ×1 IMPLANT
SHEET MEDIUM DRAPE 40X70 STRL (DRAPES) ×1 IMPLANT
SLEEVE SCD COMPRESS KNEE MED (STOCKING) ×1 IMPLANT
SPONGE GAUZE 2X2 8PLY STRL LF (GAUZE/BANDAGES/DRESSINGS) IMPLANT
SPONGE T-LAP 18X18 ~~LOC~~+RFID (SPONGE) ×2 IMPLANT
STAPLER INSORB 30 2030 C-SECTI (MISCELLANEOUS) ×1 IMPLANT
STAPLER VISISTAT 35W (STAPLE) ×2 IMPLANT
STRIP CLOSURE SKIN 1/2X4 (GAUZE/BANDAGES/DRESSINGS) IMPLANT
SUCTION FRAZIER HANDLE 10FR (MISCELLANEOUS)
SUCTION TUBE FRAZIER 10FR DISP (MISCELLANEOUS) IMPLANT
SUT ETHILON 2 0 FS 18 (SUTURE) ×1 IMPLANT
SUT ETHILON 4 0 PS 2 18 (SUTURE) IMPLANT
SUT MNCRL AB 4-0 PS2 18 (SUTURE) ×2 IMPLANT
SUT MON AB 5-0 P3 18 (SUTURE) IMPLANT
SUT PDS 3-0 CT2 (SUTURE) ×4
SUT PDS II 3-0 CT2 27 ABS (SUTURE) IMPLANT
SUT PLAIN 5 0 P 3 18 (SUTURE) IMPLANT
SUT PROLENE 5 0 P 3 (SUTURE) IMPLANT
SUT VICRYL 4-0 PS2 18IN ABS (SUTURE) IMPLANT
SUT VLOC 180 P-14 24 (SUTURE) ×1 IMPLANT
SUT VLOC 90 P-14 23 (SUTURE) IMPLANT
SWAB COLLECTION DEVICE MRSA (MISCELLANEOUS) IMPLANT
SWAB CULTURE ESWAB REG 1ML (MISCELLANEOUS) IMPLANT
SYR 50ML LL SCALE MARK (SYRINGE) IMPLANT
SYR BULB EAR ULCER 3OZ GRN STR (SYRINGE) IMPLANT
SYR BULB IRRIG 60ML STRL (SYRINGE) ×2 IMPLANT
SYR CONTROL 10ML LL (SYRINGE) ×1 IMPLANT
TOWEL GREEN STERILE FF (TOWEL DISPOSABLE) ×3 IMPLANT
TRAY DSU PREP LF (CUSTOM PROCEDURE TRAY) IMPLANT
TUBE CONNECTING 20X1/4 (TUBING) ×1 IMPLANT
TUBING INFILTRATION IT-10001 (TUBING) IMPLANT
YANKAUER SUCT BULB TIP NO VENT (SUCTIONS) ×2 IMPLANT

## 2020-09-26 NOTE — Anesthesia Preprocedure Evaluation (Addendum)
Anesthesia Evaluation  Patient identified by MRN, date of birth, ID band Patient awake    Reviewed: Allergy & Precautions, NPO status , Patient's Chart, lab work & pertinent test results  Airway Mallampati: II  TM Distance: >3 FB     Dental  (+) Dental Advisory Given   Pulmonary former smoker,    breath sounds clear to auscultation       Cardiovascular hypertension,  Rhythm:Regular Rate:Normal     Neuro/Psych  Headaches,    GI/Hepatic negative GI ROS, Neg liver ROS,   Endo/Other  Hypothyroidism Morbid obesity  Renal/GU negative Renal ROS     Musculoskeletal  (+) Arthritis ,   Abdominal   Peds  Hematology negative hematology ROS (+)   Anesthesia Other Findings   Reproductive/Obstetrics                             Anesthesia Physical Anesthesia Plan  ASA: 3  Anesthesia Plan: General   Post-op Pain Management:    Induction: Intravenous  PONV Risk Score and Plan: 3 and Dexamethasone, Ondansetron, Treatment may vary due to age or medical condition and Propofol infusion  Airway Management Planned: Oral ETT and LMA  Additional Equipment:   Intra-op Plan:   Post-operative Plan: Extubation in OR  Informed Consent: I have reviewed the patients History and Physical, chart, labs and discussed the procedure including the risks, benefits and alternatives for the proposed anesthesia with the patient or authorized representative who has indicated his/her understanding and acceptance.       Plan Discussed with: CRNA  Anesthesia Plan Comments:       Anesthesia Quick Evaluation

## 2020-09-26 NOTE — Op Note (Signed)
Operative Note   DATE OF OPERATION: 09/26/2020  LOCATION: [facility]   SURGICAL DEPARTMENT: Plastic Surgery  PREOPERATIVE DIAGNOSES: Bilateral symptomatic macromastia.  POSTOPERATIVE DIAGNOSES:  same  PROCEDURE: Bilateral breast reduction with superomedial pedicle.  SURGEON: Talmadge Coventry, MD  ASSISTANT: [place], RNFA The advanced practice practitioner (APP) assisted throughout the case.  The APP was essential in retraction and counter traction when needed to make the case progress smoothly.  This retraction and assistance made it possible to see the tissue plans for the procedure.  The assistance was needed for blood control, tissue re-approximation and assisted with closure of the incision site.  ANESTHESIA: General.  COMPLICATIONS: None.   INDICATIONS FOR PROCEDURE:  The patient, Kristina Peck is a 60 y.o. female born on [birth year], is here for treatment of bilateral symptomatic macromastia. MRN: [id number]  CONSENT:  Informed consent was obtained directly from the patient. Risks, benefits and alternatives were fully discussed. Specific risks including but not limited to bleeding, infection, hematoma, seroma, scarring, pain, infection, contracture, asymmetry, wound healing problems, and need for further surgery were all discussed. The patient did have an ample opportunity to have questions answered to satisfaction.   DESCRIPTION OF PROCEDURE:  The patient was marked preoperatively for a Wise pattern skin excision.  The patient was taken to the operating room. SCDs were placed and antibiotics were given. General anesthesia was administered.The patient's operative site was prepped and draped in a sterile fashion. A time out was performed and all information was confirmed to be correct.  Right Breast: The breast was infiltrated with tumescent solution to help with hemostasis.  The nipple was marked with a cookie cutter.  A superomedial pedicle was drawn out with  the base of at least 8 cm in size.  A breast tourniquet was then applied and the pedicle was de-epithelialized.  Breast tourniquet was then let down and all incisions were made with a 10 blade.  The pedicle was then isolated down to the chest wall with cautery and the excision was performed removing tissue primarily inferiorly and laterally.  Hemostasis was obtained and the wound was stapled closed.  Left breast:  The breast was infiltrated with tumescent solution to help with hemostasis.  The nipple was marked with a cookie cutter.  A superomedial pedicle was drawn out with the base of at least 8 cm in size.  A breast tourniquet was then applied and the pedicle was de-epithelialized.  Breast tourniquet was then let down and all incisions were made with a 10 blade.  The pedicle was then isolated down to the chest wall with cautery and the excision was performed removing tissue primarily inferiorly and laterally.  Hemostasis was obtained and the wound was stapled closed.  Patient was then set up to check for size and symmetry.  Minor modifications were made.  This resulted in a total of 1950g removed from the right side and 1948g removed from the left side.  The inframammary incision was closed with a combination of buried in-sorb staples and a running 3-0 Quill suture.  The vertical and periareolar limbs were closed with interrupted buried 4-0 Monocryl and a running 4-0 Quill suture.  Steri-Strips were then applied along with a soft dressing and Ace wrap.  The patient tolerated the procedure well.  There were no complications. The patient was allowed to wake from anesthesia, extubated and taken to the recovery room in satisfactory condition.  I was present for the entire procedure.

## 2020-09-26 NOTE — Anesthesia Procedure Notes (Addendum)
Procedure Name: Intubation Date/Time: 09/26/2020 7:54 AM Performed by: Signe Colt, CRNA Pre-anesthesia Checklist: Patient identified, Emergency Drugs available, Suction available and Patient being monitored Patient Re-evaluated:Patient Re-evaluated prior to induction Oxygen Delivery Method: Circle system utilized Preoxygenation: Pre-oxygenation with 100% oxygen Induction Type: IV induction Ventilation: Mask ventilation without difficulty Laryngoscope Size: Mac, 3 and Glidescope Grade View: Grade III Tube type: Oral Tube size: 7.0 mm Number of attempts: 2 Airway Equipment and Method: Stylet and Oral airway Placement Confirmation: ETT inserted through vocal cords under direct vision, positive ETCO2 and breath sounds checked- equal and bilateral Secured at: 21 cm Tube secured with: Tape Dental Injury: Teeth and Oropharynx as per pre-operative assessment  Comments: Smooth IV induction, easy mask airway, DL x 1 with MAC 3 unable to visualize vocal cords. Limited oral opening with redundant tissue. Glide scope utilized with visualization of VC easy atraumatic placement of 7.0 ETT. =BBS, +ETCO2, teeth lips and gums unchanged

## 2020-09-26 NOTE — Anesthesia Procedure Notes (Addendum)
Procedure Name: LMA Insertion Date/Time: 09/26/2020 12:42 PM Performed by: Signe Colt, CRNA Pre-anesthesia Checklist: Patient identified, Emergency Drugs available, Suction available and Patient being monitored Patient Re-evaluated:Patient Re-evaluated prior to induction Oxygen Delivery Method: Circle System Utilized Preoxygenation: Pre-oxygenation with 100% oxygen Induction Type: IV induction Ventilation: Mask ventilation without difficulty LMA: LMA inserted LMA Size: 4.0 Number of attempts: 1 Airway Equipment and Method: bite block Placement Confirmation: positive ETCO2 Tube secured with: Tape Dental Injury: Teeth and Oropharynx as per pre-operative assessment

## 2020-09-26 NOTE — Progress Notes (Signed)
Patient began to feel dizzy and nauseous when ambulating from stretcher to recliner chair. Dr. Claudia Desanctis at bedside once patient in recliner chair to evaluate breasts, moderate amount of sanguieous drainage on dressings of both breasts with left breast becoming more firm and larger than right breast. Doroteo Bradford, RNFA at bedside to assist with dressing changes. Dr. Ola Spurr at bedside to assist with preparing patient for return to OR. Patient's spouse at bedside as well. Patient left PACU to OR with OR staff.

## 2020-09-26 NOTE — Discharge Instructions (Addendum)
Activity As tolerated: NO showers for 3 days No heavy activities  Diet: Regular  Wound Care: Keep dressing clean & dry for 3 days.  Keep wrap applied with compression as much as possible.    Do not change dressings for 3 days unless soiled.  Can change if needed but make sure to reapply wrap. After three days can remove wrap and shower.  Then reapply dressings if needed and continue compression with wrap or soft sports bra. Call doctor if any unusual problems occur such as pain, excessive bleeding, unrelieved nausea/vomiting, fever &/or chills  Follow-up appointment: Scheduled for next week.   Post Anesthesia Home Care Instructions  Activity: Get plenty of rest for the remainder of the day. A responsible individual must stay with you for 24 hours following the procedure.  For the next 24 hours, DO NOT: -Drive a car -Paediatric nurse -Drink alcoholic beverages -Take any medication unless instructed by your physician -Make any legal decisions or sign important papers.  Meals: Start with liquid foods such as gelatin or soup. Progress to regular foods as tolerated. Avoid greasy, spicy, heavy foods. If nausea and/or vomiting occur, drink only clear liquids until the nausea and/or vomiting subsides. Call your physician if vomiting continues.  Special Instructions/Symptoms: Your throat may feel dry or sore from the anesthesia or the breathing tube placed in your throat during surgery. If this causes discomfort, gargle with warm salt water. The discomfort should disappear within 24 hours.  If you had a scopolamine patch placed behind your ear for the management of post- operative nausea and/or vomiting:  1. The medication in the patch is effective for 72 hours, after which it should be removed.  Wrap patch in a tissue and discard in the trash. Wash hands thoroughly with soap and water. 2. You may remove the patch earlier than 72 hours if you experience unpleasant side effects which may  include dry mouth, dizziness or visual disturbances. 3. Avoid touching the patch. Wash your hands with soap and water after contact with the patch.  About my Jackson-Pratt Bulb Drain  What is a Jackson-Pratt bulb? A Jackson-Pratt is a soft, round device used to collect drainage. It is connected to a long, thin drainage catheter, which is held in place by one or two small stiches near your surgical incision site. When the bulb is squeezed, it forms a vacuum, forcing the drainage to empty into the bulb.  Emptying the Jackson-Pratt bulb- To empty the bulb: 1. Release the plug on the top of the bulb. 2. Pour the bulb's contents into a measuring container which your nurse will provide. 3. Record the time emptied and amount of drainage. Empty the drain(s) as often as your     doctor or nurse recommends.  Date                  Time                    Amount (Drain 1)                 Amount (Drain 2)  _____________________________________________________________________  _____________________________________________________________________  _____________________________________________________________________  _____________________________________________________________________  _____________________________________________________________________  _____________________________________________________________________  _____________________________________________________________________  _____________________________________________________________________  Squeezing the Jackson-Pratt Bulb- To squeeze the bulb: 1. Make sure the plug at the top of the bulb is open. 2. Squeeze the bulb tightly in your fist. You will hear air squeezing from the bulb. 3. Replace the plug while the bulb is squeezed. 4. Use a safety  pin to attach the bulb to your clothing. This will keep the catheter from     pulling at the bulb insertion site.  When to call your doctor- Call your doctor if: Drain site  becomes red, swollen or hot. You have a fever greater than 101 degrees F. There is oozing at the drain site. Drain falls out (apply a guaze bandage over the drain hole and secure it with tape). Drainage increases daily not related to activity patterns. (You will usually have more drainage when you are active than when you are resting.) Drainage has a bad odor.

## 2020-09-26 NOTE — Anesthesia Postprocedure Evaluation (Signed)
Anesthesia Post Note  Patient: DEZIAH WALLEN  Procedure(s) Performed: BILATERAL MAMMARY REDUCTION  (BREAST) (Bilateral: Breast)     Patient location during evaluation: PACU Anesthesia Type: General Level of consciousness: awake and alert Pain management: pain level controlled Vital Signs Assessment: post-procedure vital signs reviewed and stable Respiratory status: spontaneous breathing, nonlabored ventilation, respiratory function stable and patient connected to nasal cannula oxygen Cardiovascular status: blood pressure returned to baseline and stable Postop Assessment: no apparent nausea or vomiting Anesthetic complications: no Comments: Pt going back to OR for evacuation hematoma on the left. Hgb 13 on hemacue. VSS.   No notable events documented.  Last Vitals:  Vitals:   09/26/20 1400 09/26/20 1415  BP: (!) 162/101 (!) 162/112  Pulse: 73 88  Resp: 18 (!) 22  Temp:    SpO2: 100% 96%    Last Pain:  Vitals:   09/26/20 1349  TempSrc:   PainSc: 0-No pain                 Tiajuana Amass

## 2020-09-26 NOTE — H&P (Signed)
Reason for Consult/CC: Macromastia  Kristina Peck is an 60 y.o. female.  HPI: Patient presents for breast reduction  Allergies:  Allergies  Allergen Reactions   Lisinopril Hives    Medications:  Current Facility-Administered Medications:    ceFAZolin (ANCEF) IVPB 3g/100 mL premix, 3 g, Intravenous, On Call to OR, [name], MD   6 CHG cloth bath night before surgery, , , Once **AND** [START ON 09/27/2020] 6 CHG cloth bath AM of surgery, , , Once **AND** Chlorhexidine Gluconate Cloth 2 % PADS 6 each, 6 each, Topical, Once **AND** Chlorhexidine Gluconate Cloth 2 % PADS 6 each, 6 each, Topical, Once, Makai Dumond, Steffanie Dunn, MD   lactated ringers infusion, , Intravenous, Continuous, Effie Berkshire, MD, New Bag at 09/26/20 [id number]  Past Medical History:  Diagnosis Date   Anginal pain ([place])    Anxiety    Arthritis    Breast hypertrophy in female 08/18/2016   Depression    Hypertension    Hypothyroidism    Macromastia    Thyroid disease     Past Surgical History:  Procedure Laterality Date   ABDOMINAL HYSTERECTOMY     APPENDECTOMY     COLON SURGERY     KNEE ARTHROSCOPY Right 07/10/2015   KNEE ARTHROSCOPY W/ MENISCAL REPAIR Right 05/13/2014   MEDIAL PARTIAL KNEE REPLACEMENT Right [id number]   TUBAL LIGATION Bilateral 06/09/2009   Bilateral salphingo-oophorectomy    Family History  Problem Relation Age of Onset   Diabetes Mother    Hypertension Mother    Hypertension Father    Aneurysm Father        aortic   Hypertension Sister    Heart attack Maternal Grandmother    Heart failure Maternal Grandfather    Heart attack Paternal Grandmother    Cancer Paternal Grandfather        not sure   Breast cancer Neg Hx     Social History:  reports that she quit smoking about 12 years ago. Her smoking use included cigarettes. She started smoking about 32 years ago. She has a 10.00 pack-year smoking history. She has never used smokeless tobacco. She reports current alcohol use. She reports  that she does not use drugs.  Physical Exam Blood pressure (!) 185/90, pulse 67, temperature 97.7 F (36.5 C), temperature source Oral, resp. rate 18, height '5\' 7"'$  (1.702 m), weight 128.9 kg, SpO2 100 %. General: No acute distress Cardiovascular: Regular rhythm Respiratory: Nonlabored Breast: No change  Results for orders placed or performed during the hospital encounter of 09/26/20 (from the past 48 hour(s))  Basic metabolic panel per protocol     Status: Abnormal   Collection Time: 09/24/20  3:14 PM  Result Value Ref Range   Sodium 135 135 - 145 mmol/L   Potassium 3.7 3.5 - 5.1 mmol/L   Chloride 99 98 - 111 mmol/L   CO2 27 22 - 32 mmol/L   Glucose, Bld 84 70 - 99 mg/dL    Comment: Glucose reference range applies only to samples taken after fasting for at least 8 hours.   BUN 7 6 - 20 mg/dL   Creatinine, Ser 0.88 0.44 - 1.00 mg/dL   Calcium 8.8 (L) 8.9 - 10.3 mg/dL   GFR, Estimated >60 >60 mL/min    Comment: (NOTE) Calculated using the CKD-EPI Creatinine Equation (2021)    Anion gap 9 5 - 15    Comment: Performed at [facility] [address] [place], [place] [address]  No results found.  Assessment/Plan: Patient presents for breast reduction today.  Risks and benefits discussed.  Proceed with surgery.  [name] 09/26/2020, 7:35 AM

## 2020-09-26 NOTE — Op Note (Signed)
Operative Note   DATE OF OPERATION: 09/26/2020  SURGICAL DEPARTMENT: Plastic Surgery  PREOPERATIVE DIAGNOSES: Left breast hematoma  POSTOPERATIVE DIAGNOSES:  same  PROCEDURE: Evacuation left breast hematoma  SURGEON: Talmadge Coventry, MD  ASSISTANT: [place], RNFA  ANESTHESIA:  General.   COMPLICATIONS: None.   INDICATIONS FOR PROCEDURE:  The patient, Kristina Peck is a 60 y.o. female born on 05/07/1960, is here for treatment of left breast hematoma MRN: [id number]  CONSENT:  Informed consent was obtained directly from the patient. Risks, benefits and alternatives were fully discussed. Specific risks including but not limited to bleeding, infection, hematoma, seroma, scarring, pain, contracture, asymmetry, wound healing problems, and need for further surgery were all discussed. The patient did have an ample opportunity to have questions answered to satisfaction.   DESCRIPTION OF PROCEDURE:  The patient was taken to the operating room. SCDs were placed and antibiotics were given.  General anesthesia was administered.  The patient's operative site was prepped and draped in a sterile fashion. A time out was performed and all information was confirmed to be correct.  Started by opening the inferior incision.  About 400 cc of hematoma was evacuated.  Copious irrigation was performed.  No active bleeding was identified.  Any small oozing that looks questionable was cauterized.  I followed this with additional copious irrigation and did not identify any further bleeding.  106 French drain was placed and secured with a nylon suture.  The skin was then closed with a combination of 3-0 PDS in an sorb staples and a running 3 OV lock.  Patient tolerated the procedure well.  Steri-Strips and a compressive dressing were applied.  The patient tolerated the procedure well.  There were no complications. The patient was allowed to wake from anesthesia, extubated and taken to the recovery room in  satisfactory condition.

## 2020-09-26 NOTE — Interval H&P Note (Signed)
History and Physical Interval Note:  09/26/2020 7:36 AM  Kristina Peck  has presented today for surgery, with the diagnosis of macromastia.  The various methods of treatment have been discussed with the patient and family. After consideration of risks, benefits and other options for treatment, the patient has consented to  Procedure(s) with comments: BILATERAL MAMMARY REDUCTION  (BREAST) (Bilateral) - 2 hours as a surgical intervention.  The patient's history has been reviewed, patient examined, no change in status, stable for surgery.  I have reviewed the patient's chart and labs.  Questions were answered to the patient's satisfaction.     [name]

## 2020-09-26 NOTE — Transfer of Care (Signed)
Immediate Anesthesia Transfer of Care Note  Patient: Kristina Peck  Procedure(s) Performed: EVACUATION HEMATOMA LEFT BREAST (Left: Breast)  Patient Location: PACU  Anesthesia Type:General  Level of Consciousness: awake, alert , oriented and patient cooperative  Airway & Oxygen Therapy: Patient Spontanous Breathing and Patient connected to face mask oxygen  Post-op Assessment: Report given to RN and Post -op Vital signs reviewed and stable  Post vital signs: Reviewed and stable  Last Vitals:  Vitals Value Taken Time  BP    Temp    Pulse 70 09/26/20 1348  Resp 12 09/26/20 1348  SpO2 100 % 09/26/20 1348  Vitals shown include unvalidated device data.  Last Pain:  Vitals:   09/26/20 1145  TempSrc:   PainSc: 5       Patients Stated Pain Goal: 1 (Q000111Q [id number])  Complications: No notable events documented.

## 2020-09-26 NOTE — Transfer of Care (Signed)
Immediate Anesthesia Transfer of Care Note  Patient: Kristina Peck  Procedure(s) Performed: BILATERAL MAMMARY REDUCTION  (BREAST) (Bilateral: Breast)  Patient Location: PACU  Anesthesia Type:General  Level of Consciousness: awake, alert , oriented and patient cooperative  Airway & Oxygen Therapy: Patient Spontanous Breathing and Patient connected to face mask oxygen  Post-op Assessment: Report given to RN and Post -op Vital signs reviewed and stable  Post vital signs: Reviewed and stable  Last Vitals:  Vitals Value Taken Time  BP    Temp    Pulse 83 09/26/20 1005  Resp 18 09/26/20 1007  SpO2 97 % 09/26/20 1005  Vitals shown include unvalidated device data.  Last Pain:  Vitals:   09/26/20 0631  TempSrc: Oral  PainSc: 0-No pain      Patients Stated Pain Goal: 3 (Q000111Q [id number])  Complications: No notable events documented.

## 2020-09-26 NOTE — Brief Op Note (Signed)
09/26/2020  9:57 AM  PATIENT:  Kristina Peck  60 y.o. female  PRE-OPERATIVE DIAGNOSIS:  macromastia  POST-OPERATIVE DIAGNOSIS:  macromastia  PROCEDURE:  Procedure(s) with comments: BILATERAL MAMMARY REDUCTION  (BREAST) (Bilateral) - 2 hours  SURGEON:  Surgeon(s) and Role:    * Yazmyn Valbuena, Steffanie Dunn, MD - Primary  PHYSICIAN ASSISTANT: [place], RNFA  ASSISTANTS: none   ANESTHESIA:   general  EBL:  50 mL   BLOOD ADMINISTERED:none  DRAINS: Penrose drain in the bilateral breast    LOCAL MEDICATIONS USED:  MARCAINE     SPECIMEN:  Source of Specimen:  bilateral breast tissue  DISPOSITION OF SPECIMEN:  PATHOLOGY  COUNTS:  YES  TOURNIQUET:  * No tourniquets in log *  DICTATION: .Dragon Dictation  PLAN OF CARE: Discharge to home after PACU  PATIENT DISPOSITION:  PACU - hemodynamically stable.   Delay start of Pharmacological VTE agent (>24hrs) due to surgical blood loss or risk of bleeding: not applicable

## 2020-09-26 NOTE — Anesthesia Preprocedure Evaluation (Signed)
Anesthesia Evaluation  Patient identified by MRN, date of birth, ID band Patient awake    Reviewed: Allergy & Precautions, NPO status , Patient's Chart, lab work & pertinent test results  Airway Mallampati: II  TM Distance: >3 FB     Dental  (+) Dental Advisory Given   Pulmonary former smoker,    breath sounds clear to auscultation       Cardiovascular hypertension,  Rhythm:Regular Rate:Normal     Neuro/Psych  Headaches,    GI/Hepatic negative GI ROS, Neg liver ROS,   Endo/Other  Hypothyroidism Morbid obesity  Renal/GU negative Renal ROS     Musculoskeletal  (+) Arthritis ,   Abdominal   Peds  Hematology negative hematology ROS (+)   Anesthesia Other Findings   Reproductive/Obstetrics                             Anesthesia Physical  Anesthesia Plan  ASA: 3  Anesthesia Plan: General   Post-op Pain Management:    Induction: Intravenous  PONV Risk Score and Plan: 3 and Dexamethasone, Ondansetron, Treatment may vary due to age or medical condition and Propofol infusion  Airway Management Planned: LMA  Additional Equipment:   Intra-op Plan:   Post-operative Plan: Extubation in OR  Informed Consent: I have reviewed the patients History and Physical, chart, labs and discussed the procedure including the risks, benefits and alternatives for the proposed anesthesia with the patient or authorized representative who has indicated his/her understanding and acceptance.       Plan Discussed with: CRNA  Anesthesia Plan Comments:         Anesthesia Quick Evaluation

## 2020-09-27 DIAGNOSIS — N62 Hypertrophy of breast: Secondary | ICD-10-CM | POA: Diagnosis not present

## 2020-09-27 NOTE — Anesthesia Postprocedure Evaluation (Signed)
Anesthesia Post Note  Patient: Kristina Peck  Procedure(s) Performed: EVACUATION HEMATOMA LEFT BREAST (Left: Breast)     Patient location during evaluation: PACU Anesthesia Type: General Level of consciousness: awake and alert Pain management: pain level controlled Vital Signs Assessment: post-procedure vital signs reviewed and stable Respiratory status: spontaneous breathing, nonlabored ventilation, respiratory function stable and patient connected to nasal cannula oxygen Cardiovascular status: blood pressure returned to baseline and stable Postop Assessment: no apparent nausea or vomiting Anesthetic complications: no   No notable events documented.  Last Vitals:  Vitals:   09/27/20 0130 09/27/20 0630  BP: (!) 153/76 (!) 146/73  Pulse: 87 80  Resp: 16 18  Temp: 36.9 C 36.5 C  SpO2: 100% 100%    Last Pain:  Vitals:   09/27/20 0630  TempSrc:   PainSc: 6                  Tiajuana Amass

## 2020-09-28 NOTE — Patient Instructions (Signed)
Patient is reminded to call for any changes/concerns

## 2020-09-28 NOTE — Progress Notes (Signed)
09/18/20     Patient ID: Kristina Peck, female    DOB: October 31, 1960, 60 y.o.   MRN: [id number]  Chief Complaint  Patient presents with   Pre-op Exam    No diagnosis found. Bilateral macromastia    History of Present Illness: Kristina Peck is a 60 y.o.  female  with a history of bilateral macromastia.  She presents for preoperative evaluation for upcoming procedure, Bilateral Breast Reduction , scheduled for 09/26/20 with Dr.  Claudia Desanctis  The patient has had problems with anesthesia.  Patient had gynecology/surgery in [place], [place] which she was told she has some difficulty with intubation- but no other symptoms/complications following that procedure. She has had 4- surgeries since this episode- without any difficulty with intubation. No PONV & no family hx of anesthesia complication.  Summary of Previous Visit: consult for bilateral breast reduction.  Estimated excess breast tissue to be removed at time of surgery: 1,200 gm on the left and 1,200 gm on the right.  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  PMH Significant for:  BMI= 43.69 Smoker- quit 08/20/20 Anxiety Had cardiac/stress test last year- negative for any cardiac complications- Dx = anxiety HTN Hypothyroid Lower extremity arthritis Right knee surgery/arthroscopy/partial replacement    Past Medical History: Allergies: Allergies  Allergen Reactions   Lisinopril Hives    Current Medications:  Current Outpatient Medications:    ARMOUR THYROID 180 MG tablet, TAKE 1 TABLET BY MOUTH DAILY ON AN EMPTY STOMACH., Disp: , Rfl:    busPIRone (BUSPAR) 15 MG tablet, Take 15 mg by mouth 2 (two) times daily., Disp: , Rfl:    FLUoxetine HCl (PROZAC PO), Take 100 mg by mouth., Disp: , Rfl:    hydrochlorothiazide (HYDRODIURIL) 25 MG tablet, Take 1 tablet (25 mg total) by mouth daily., Disp: 30 tablet, Rfl: 0   LORazepam (ATIVAN) 0.5 MG tablet, Take 0.5 mg by mouth daily as needed., Disp: , Rfl:    losartan (COZAAR) 100 MG tablet, Take 1 tablet  (100 mg total) by mouth daily., Disp: 30 tablet, Rfl: 0   traZODone (DESYREL) 50 MG tablet, Take 0.5-1 tablets (25-50 mg total) by mouth at bedtime as needed. for sleep, Disp: 30 tablet, Rfl: 0   Vitamin D, Ergocalciferol, (DRISDOL) 1.25 MG (50000 UNIT) CAPS capsule, Take 50,000 Units by mouth once a week., Disp: , Rfl:    HYDROcodone-acetaminophen (NORCO) 5-325 MG tablet, Take 1 tablet by mouth every 6 (six) hours as needed for moderate pain., Disp: 30 tablet, Rfl: 0   ondansetron (ZOFRAN) 4 MG tablet, Take 1 tablet (4 mg total) by mouth every 8 (eight) hours as needed for nausea or vomiting., Disp: 5 tablet, Rfl: 0  Past Medical Problems: Past Medical History:  Diagnosis Date   Anginal pain ([place])    Anxiety    Arthritis    Breast hypertrophy in female 08/18/2016   Depression    Hypertension    Hypothyroidism    Macromastia    Thyroid disease     Past Surgical History: Past Surgical History:  Procedure Laterality Date   ABDOMINAL HYSTERECTOMY     APPENDECTOMY     COLON SURGERY     KNEE ARTHROSCOPY Right 07/10/2015   KNEE ARTHROSCOPY W/ MENISCAL REPAIR Right 05/13/2014   MEDIAL PARTIAL KNEE REPLACEMENT Right [id number]   TUBAL LIGATION Bilateral 06/09/2009   Bilateral salphingo-oophorectomy    Social History: Social History   Socioeconomic History   Marital status: Married    Spouse name: Not on file   Number  of children: 2   Years of education: Not on file   Highest education level: Not on file  Occupational History   Occupation: Counselling psychologist   Tobacco Use   Smoking status: Former    Packs/day: 0.50    Years: 20.00    Pack years: 10.00    Types: Cigarettes    Start date: 03/08/1988    Quit date: 03/08/2008    Years since quitting: 12.5   Smokeless tobacco: Never  Vaping Use   Vaping Use: Never used  Substance and Sexual Activity   Alcohol use: Yes    Alcohol/week: 0.0 standard drinks    Comment: moderately   Drug use: No   Sexual activity: Yes    Partners:  Male    Birth control/protection: Surgical  Other Topics Concern   Not on file  Social History Narrative   Not on file   Social Determinants of Health   Financial Resource Strain: Not on file  Food Insecurity: Not on file  Transportation Needs: Not on file  Physical Activity: Not on file  Stress: Not on file  Social Connections: Not on file  Intimate Partner Violence: Not on file    Family History: Family History  Problem Relation Age of Onset   Diabetes Mother    Hypertension Mother    Hypertension Father    Aneurysm Father        aortic   Hypertension Sister    Heart attack Maternal Grandmother    Heart failure Maternal Grandfather    Heart attack Paternal Grandmother    Cancer Paternal Grandfather        not sure   Breast cancer Neg Hx     Review of Systems: Denies any of the following: Asthma/sleep apnea/oxygen supplement No varicose veins/hx of DVT/PE No swelling legs/feet or ankles Has family hx of MI/CAD/HTN- mom/dad , maternal/paternal grandparents Dad= aortic aneursym She is not diabetic & no anticoagulants Current bra size= 42 I/J would like to be D-cup if possible.  Physical Exam: Vital Signs BP (!) 179/117 (BP Location: Left Arm, Patient Position: Sitting, Cuff Size: Large)   Pulse 73   Ht '5\' 7"'$  (1.702 m)   Wt 283 lb 12.8 oz (128.7 kg)   SpO2 98%   BMI 44.45 kg/m   Physical Exam  Constitutional:      General: Not in acute distress.    Appearance: Normal appearance. Not ill-appearing.  HENT:     Head: Normocephalic and atraumatic.  Eyes:     Pupils: Pupils are equal, round Neck:     Musculoskeletal: Normal range of motion.  Cardiovascular:     Rate and Rhythm: Normal rate    Pulses: Normal pulses.  Pulmonary:     Effort: Pulmonary effort is normal. No respiratory distress.  Abdominal:     General: Abdomen is flat. There is no distension.  Musculoskeletal: Normal range of motion.  Skin:    General: Skin is warm and dry.      Findings: No erythema or rash.  Neurological:     General: No focal deficit present.     Mental Status: Alert and oriented to person, place, and time. Mental status is at baseline.     Motor: No weakness.  Psychiatric:        Mood and Affect: Mood normal.        Behavior: Behavior normal.    Assessment/Plan: The patient is scheduled for bilateral breast reduction with Dr. Claudia Desanctis.  Risks, benefits, and alternatives of  procedure discussed, questions answered and consent obtained.    Smoking Status: quit 08/20/20; Counseling Given? yes Last Mammogram: may 2022; Results: neg  Caprini Score: 4 ; Risk Factors include: age , BMI =43.69  and length of planned surgery. Recommendation for mechanical SCD & Encourage early ambulation.   Pictures obtained: yes  Post-op Rx sent to pharmacy: Dr. Claudia Desanctis  Patient was provided with the breast reduction and General Surgical Risk consent document and Pain Medication Agreement prior to their appointment.  They had adequate time to read through the risk consent documents and Pain Medication Agreement. We also discussed them in person together during this preop appointment. All of their questions were answered to their satisfaction.  Recommended calling if they have any further questions.  Risk consent form and Pain Medication Agreement to be scanned into patient's chart.  The risk that can be encountered with breast reduction were discussed and include the following but not limited to these:  Breast asymmetry, fluid accumulation, firmness of the breast, inability to breast feed, loss of nipple or areola, skin loss, decrease or no nipple sensation, fat necrosis of the breast tissue, bleeding, infection, healing delay.  There are risks of anesthesia, changes to skin sensation and injury to nerves or blood vessels.  The muscle can be temporarily or permanently injured.  You may have an allergic reaction to tape, suture, glue, blood products which can result in skin  discoloration, swelling, pain, skin lesions, poor healing.  Any of these can lead to the need for revisonal surgery or stage procedures.  A reduction has potential to interfere with diagnostic procedures.  Nipple or breast piercing can increase risks of infection.  This procedure is best done when the breast is fully developed.  Changes in the breast will continue to occur over time.  Pregnancy can alter the outcomes of previous breast reduction surgery, weight gain and weigh loss can also effect the long term appearance.   Lipo?  Electronically signed by: [place], RN 09/28/2020 5:28 PM

## 2020-09-29 ENCOUNTER — Encounter (HOSPITAL_BASED_OUTPATIENT_CLINIC_OR_DEPARTMENT_OTHER): Payer: Self-pay | Admitting: Plastic Surgery

## 2020-09-29 LAB — SURGICAL PATHOLOGY

## 2020-09-29 NOTE — Discharge Summary (Signed)
Physician Discharge Summary  Patient ID: Kristina Peck MRN: [id number] DOB/AGE: [birth year] 60 y.o.  Admit date: 09/26/2020 Discharge date: 09/29/2020  Admission Diagnoses:  Discharge Diagnoses:  Active Problems:   Macromastia   Discharged Condition: good  Hospital Course: Tolerated procedure.  Had to return to OR for hematoma.  Consults: None  Significant Diagnostic Studies: None  Treatments: IV hydration  Discharge Exam: Blood pressure (!) 146/73, pulse 80, temperature 97.7 F (36.5 C), resp. rate 18, height '5\' 7"'$  (1.702 m), weight 128.9 kg, SpO2 100 %. General appearance: alert  Disposition: Discharge disposition: 01-Home or Self Care       Discharge Instructions     Diet - low sodium heart healthy   Complete by: As directed    Diet - low sodium heart healthy   Complete by: As directed    Increase activity slowly   Complete by: As directed    Increase activity slowly   Complete by: As directed       Allergies as of 09/27/2020       Reactions   Lisinopril Hives        Medication List     TAKE these medications    Armour Thyroid 180 MG tablet Generic drug: thyroid TAKE 1 TABLET BY MOUTH DAILY ON AN EMPTY STOMACH.   busPIRone 15 MG tablet Commonly known as: BUSPAR Take 15 mg by mouth 2 (two) times daily.   hydrochlorothiazide 25 MG tablet Commonly known as: HYDRODIURIL Take 1 tablet (25 mg total) by mouth daily.   HYDROcodone-acetaminophen 5-325 MG tablet Commonly known as: Norco Take 1 tablet by mouth every 6 (six) hours as needed for moderate pain.   LORazepam 0.5 MG tablet Commonly known as: ATIVAN Take 0.5 mg by mouth daily as needed.   losartan 100 MG tablet Commonly known as: COZAAR Take 1 tablet (100 mg total) by mouth daily.   ondansetron 4 MG tablet Commonly known as: Zofran Take 1 tablet (4 mg total) by mouth every 8 (eight) hours as needed for nausea or vomiting.   PROZAC PO Take 100 mg by mouth.   traZODone 50 MG  tablet Commonly known as: DESYREL Take 0.5-1 tablets (25-50 mg total) by mouth at bedtime as needed. for sleep   Vitamin D (Ergocalciferol) 1.25 MG (50000 UNIT) Caps capsule Commonly known as: DRISDOL Take 50,000 Units by mouth once a week.        Signed: [name] 09/29/2020, 9:47 AM

## 2020-10-02 ENCOUNTER — Ambulatory Visit (INDEPENDENT_AMBULATORY_CARE_PROVIDER_SITE_OTHER): Payer: Managed Care, Other (non HMO) | Admitting: Plastic Surgery

## 2020-10-02 ENCOUNTER — Other Ambulatory Visit: Payer: Self-pay

## 2020-10-02 ENCOUNTER — Telehealth: Payer: Self-pay

## 2020-10-02 DIAGNOSIS — N62 Hypertrophy of breast: Secondary | ICD-10-CM

## 2020-10-02 NOTE — Progress Notes (Signed)
Patient presents 1 week postop from bilateral breast reduction.  I did have to take her back for hematoma on the left side.  She feels like things have gone well.  I did place a drain on the left side of the takeback which is putting out less than 20 cc a day so that was removed.  Additionally a Penrose drain from the right side was removed.  On exam she looks symmetric with appropriate shape size and symmetry.  Minimal bruising.  She is wondering if she might be a little bit too small but will give her some time to consider that and to allow the tissue to soften up.  I do think she is probably still a D cup.  Overall everything looks to be healing well I have asked her to continue to wear supportive garment and avoid strenuous activity.  We will plan to see her again in a few weeks.  All of her questions were answered.

## 2020-10-02 NOTE — Telephone Encounter (Signed)
Patient called with a few questions:  Does she need to wear a sports bra all the time, including when she goes to sleep? Patient said her sports bra is rubbing her incision area and it's uncomfortable.  She would like to know if there is an alternative. Patient would like to know if the sports bra is needed for support.  Please call.

## 2020-10-03 NOTE — Telephone Encounter (Signed)
Returned patients call. LMVM, to wear sports bra 24/7 other then when taking a shower. May use maxi pads, ABD pads (can purchase in the pharmacy area) between the incision and band or sports bra. Also, can wear a thin tank top under sports bra to prevent friction or irritation. Call back if she has further questions.

## 2020-10-17 ENCOUNTER — Telehealth: Payer: Self-pay | Admitting: Plastic Surgery

## 2020-10-17 NOTE — Telephone Encounter (Signed)
Patient left voicemail saying that she has not been feeling well the last couple of days. She said that she has a post on Wednesday but doesn't think she should wait that long to be seen as she was in a lot of pain. She took pain meds last night. Please call to advise if she needs to come in before Wednesday or what to do.

## 2020-10-20 NOTE — Telephone Encounter (Signed)
Returned patients call on Friday, 10/20/2020. BL breast reduction, and evacuation of hematoma of left breast was performed with Dr. Claudia Desanctis on 09/26/2020.  She has been moving around a lot more than normal recently. At this time she is complaining of pain under BL IMF, and tenderness of BL axillary area. Early Friday, she had fever with chills, but appeared to have broken. Denies any nausea or vomiting. Also mentioned when she walks, both breast bounce. She has been wearing sports bras 24/7 but feels they are not tight enough. Advised for her to go back to wearing the ace wrap for better compression and alternate IBU with tylenol for pain. Keep follow up appointment this Weds, August the 17th with [facility]. Patient agreed and understood with plan.

## 2020-10-22 ENCOUNTER — Other Ambulatory Visit: Payer: Self-pay

## 2020-10-22 ENCOUNTER — Ambulatory Visit (INDEPENDENT_AMBULATORY_CARE_PROVIDER_SITE_OTHER): Payer: Managed Care, Other (non HMO)

## 2020-10-22 VITALS — BP 179/117 | Temp 98.1°F | Ht 67.0 in | Wt 283.8 lb

## 2020-10-22 DIAGNOSIS — N62 Hypertrophy of breast: Secondary | ICD-10-CM

## 2020-10-22 NOTE — Progress Notes (Signed)
Patient in office today for postop-f/u visit S/P Bilateral breast reduction on 09/26/20 by Dr. Claudia Desanctis On exam - steri strips are still in place.  I removed these & the incisions appear intact and are healing. There is a small area on the left breast that appears abraded at the t-junction site.  It is superficial and there is no opening.  No purulent drainage or other sign of infection.   She has intermittent mild pain- as she describes as "prickly" at both axilla areas- but these areas are healing & there is no redness/swelling or other sign of complication. She reports that this mild discomfort has improved significantly over the last 2 days.  She has used OTC Tylenol as needed. She denies any chills/fever & no n/v.  She continues to wear her sports bra. I instructed her to use vaseline on all the incisions & she wants to use OTC scar cream on the incisions- which I recommend she continue to use vaseline until the area under left breast has healed- then she can start scar tx. She is pleased with the result so far- she does seem to have some swelling- but breast are symmetrical.  I instructed her to use gauze or abd pads in axilla area to avoid friction from bra in this area to ease the tenderness in these areas. She is also instructed to call for any concerns or changes. Her next f/u is 11/05/20 with Catalina Antig[place], PA-  she understands that I will consult  & forward the photos & notes from today's visit to Dr. Claudia Desanctis for review.

## 2020-10-22 NOTE — Patient Instructions (Signed)
Use vaseline on incisions continue sports bra Call for any concerns

## 2020-10-27 ENCOUNTER — Telehealth: Payer: Self-pay | Admitting: Surgical

## 2020-10-27 ENCOUNTER — Telehealth: Payer: Self-pay | Admitting: Plastic Surgery

## 2020-10-27 ENCOUNTER — Encounter: Payer: Self-pay | Admitting: Plastic Surgery

## 2020-10-27 NOTE — Telephone Encounter (Signed)
Patient reports not feeling well and is experiencing diarrhea and fatigue. Per patient, the incision is "to the white meat" and has been applying Vaseline. Reduction sx 7/22. Please call to advise [phone]. MyChart message sent. Thank you.

## 2020-10-27 NOTE — Telephone Encounter (Signed)
Corresponded via [place].

## 2020-10-27 NOTE — Telephone Encounter (Signed)
Patient is a 60 year old female status post bilateral breast reduction 1 month ago, she has some wounds that she has been caring for with Vaseline and gauze.  She reported that she was having some GI symptoms over the past few days, called patient to discuss. Patient reports she was having some nausea, vomiting and diarrhea.  Reports that this has improved today and she has not had any symptoms today.  She is not having any fevers.  She has been receiving assistance with care for the wounds of her left breast from family.  She reports that she is having tenderness with moving her arm, mostly having pain in the axillary region of both sides.   She is scheduled to return back to work September 2, she is worried about returning to work in the current state she is in.  She is scheduled to come in for an evaluation next week on 8/31, recommend continuing to monitor her symptoms, using Tylenol and ibuprofen to help with swelling and discomfort.  Recommend calling if her symptoms worsen.  I did discuss the patient that I do not see any signs of infection after reviewing the photos she sent through EMR.  I also discussed with her that pictures can sometimes be inconclusive and that if she has worsening symptoms or changes that she let us know.  I do think that if her GI symptoms continue she should be evaluated by her primary care provider.

## 2020-11-05 ENCOUNTER — Ambulatory Visit (INDEPENDENT_AMBULATORY_CARE_PROVIDER_SITE_OTHER): Payer: Managed Care, Other (non HMO) | Admitting: Surgical

## 2020-11-05 ENCOUNTER — Other Ambulatory Visit: Payer: Self-pay

## 2020-11-05 ENCOUNTER — Encounter: Payer: Self-pay | Admitting: Surgical

## 2020-11-05 DIAGNOSIS — N62 Hypertrophy of breast: Secondary | ICD-10-CM

## 2020-11-05 NOTE — Progress Notes (Signed)
Patient is a 60 year old female here for follow-up after bilateral breast reduction with Dr. Claudia Desanctis on 09/26/2020.  She subsequently returned to the OR same day for evacuation of hematoma of her left breast.  She is overall doing much better, reports some abnormal sensations within the axilla.  She has been doing Vaseline and gauze to the left IMF wound and left NAC wound.  She is not having any infectious symptoms.  Chaperone present on exam On exam bilateral NAC's are viable, right breast incisions are intact and well-healed.  She has some swelling of the right breast, possible slight subcutaneous fluid collection noted with palpation.  The skin is not taut there is minimal tension on the incisions.  No erythema or cellulitic changes of either breast noted.  The left breast has a wound at the T-junction that is approximately 5 x 5 mm.  There is no surrounding erythema or cellulitic changes noted.  Discussed with patient the abnormal sensation in the axilla is normal and typically resolves in a few weeks.  I do not see any signs of infection on exam.  I recommend continuing with Vaseline and gauze to the left breast wound and left breast NAC.  Recommend calling with questions or concerns.  Recommend following up in 1 month for reevaluation.

## 2020-11-06 ENCOUNTER — Ambulatory Visit: Payer: Managed Care, Other (non HMO) | Admitting: Dermatology

## 2020-12-10 ENCOUNTER — Encounter: Payer: Self-pay | Admitting: Surgical

## 2020-12-10 ENCOUNTER — Other Ambulatory Visit: Payer: Self-pay

## 2020-12-10 ENCOUNTER — Ambulatory Visit (INDEPENDENT_AMBULATORY_CARE_PROVIDER_SITE_OTHER): Payer: Managed Care, Other (non HMO) | Admitting: Surgical

## 2020-12-10 DIAGNOSIS — N62 Hypertrophy of breast: Secondary | ICD-10-CM

## 2020-12-10 NOTE — Progress Notes (Signed)
Patient is a 60 year old female here for follow-up after bilateral breast reduction with Dr. Claudia Desanctis on 09/26/2020.  She reports overall she is doing well.   She does have a suture protruding through the left breast that is bothering her.  Chaperone present on exam On exam bilateral NAC's are viable, bilateral breast incisions are intact and well-healed.  Suture knot noted at the T-junction of the left breast.  No erythema or cellulitic changes noted of either breast.  No subcutaneous fluid collection noted palpation.  No more restrictions, can begin wearing a normal bra without an underwire in 1 month.  Can begin using scar cream.  Recommend calling with questions or concerns and following up as needed.  Pictures were taken and placed in the patient's chart with patient's permission.

## 2020-12-12 NOTE — Addendum Note (Signed)
Addended by: Lindon Romp on: 12/12/2020 12:19 PM   Modules accepted: Orders

## 2022-04-26 ENCOUNTER — Ambulatory Visit: Payer: Managed Care, Other (non HMO) | Admitting: Family

## 2022-04-26 ENCOUNTER — Encounter: Payer: Self-pay | Admitting: Family

## 2022-04-26 ENCOUNTER — Other Ambulatory Visit: Payer: Self-pay | Admitting: Family

## 2022-04-26 VITALS — BP 160/84 | HR 81 | Ht 67.0 in | Wt 286.8 lb

## 2022-04-26 DIAGNOSIS — I10 Essential (primary) hypertension: Secondary | ICD-10-CM | POA: Diagnosis not present

## 2022-04-26 DIAGNOSIS — N898 Other specified noninflammatory disorders of vagina: Secondary | ICD-10-CM | POA: Diagnosis not present

## 2022-04-26 DIAGNOSIS — G43009 Migraine without aura, not intractable, without status migrainosus: Secondary | ICD-10-CM

## 2022-04-26 DIAGNOSIS — E890 Postprocedural hypothyroidism: Secondary | ICD-10-CM

## 2022-04-26 DIAGNOSIS — F419 Anxiety disorder, unspecified: Secondary | ICD-10-CM | POA: Diagnosis not present

## 2022-04-26 DIAGNOSIS — Z131 Encounter for screening for diabetes mellitus: Secondary | ICD-10-CM

## 2022-04-26 DIAGNOSIS — E789 Disorder of lipoprotein metabolism, unspecified: Secondary | ICD-10-CM

## 2022-04-26 DIAGNOSIS — E559 Vitamin D deficiency, unspecified: Secondary | ICD-10-CM

## 2022-04-26 DIAGNOSIS — Z6841 Body Mass Index (BMI) 40.0 and over, adult: Secondary | ICD-10-CM

## 2022-04-26 DIAGNOSIS — E538 Deficiency of other specified B group vitamins: Secondary | ICD-10-CM

## 2022-04-26 MED ORDER — UBRELVY 100 MG PO TABS: 1.0000 | ORAL_TABLET | Freq: Every day | ORAL | 3 refills | Status: DC | PRN

## 2022-04-26 MED ORDER — ZEPBOUND 2.5 MG/0.5ML ~~LOC~~ SOAJ: 2.5000 mg | SUBCUTANEOUS | 0 refills | Status: DC

## 2022-04-26 NOTE — Assessment & Plan Note (Signed)
Patient would like to try zenpep. I will see if I can get the RX coverage entered into the system so I can look to see if this will be covered.

## 2022-04-26 NOTE — Progress Notes (Signed)
Established Patient Office Visit  Subjective:  Patient ID: Kristina Peck, female    DOB: [birth year]  Age: 62 y.o. MRN: [id number]  Chief Complaint  Patient presents with   Follow-up    BP high    Hypertension This is a chronic problem. The current episode started more than 1 year ago. The problem has been waxing and waning since onset. The problem is uncontrolled. Associated symptoms include anxiety and headaches. Pertinent negatives include no peripheral edema. Agents associated with hypertension include thyroid hormones. Risk factors for coronary artery disease include dyslipidemia, obesity, sedentary lifestyle and stress. Past treatments include ACE inhibitors, angiotensin blockers and diuretics. The current treatment provides moderate improvement. Compliance problems include diet and exercise.  Identifiable causes of hypertension include a thyroid problem.     Past Medical History:  Diagnosis Date   Anginal pain ([place])    Anxiety    Arthritis    Breast hypertrophy in female 08/18/2016   Depression    Encounter for screening mammogram for breast cancer 09/26/2015   Hypertension    Hypothyroidism    Macromastia    Thyroid disease     Social History   Socioeconomic History   Marital status: Married    Spouse name: Not on file   Number of children: 2   Years of education: Not on file   Highest education level: Not on file  Occupational History   Occupation: Counselling psychologist   Tobacco Use   Smoking status: Former    Packs/day: 0.50    Years: 20.00    Total pack years: 10.00    Types: Cigarettes    Start date: 03/08/1988    Quit date: 03/08/2008    Years since quitting: 14.1   Smokeless tobacco: Never  Vaping Use   Vaping Use: Never used  Substance and Sexual Activity   Alcohol use: Yes    Alcohol/week: 0.0 standard drinks of alcohol    Comment: moderately   Drug use: No   Sexual activity: Yes    Partners: Male    Birth control/protection: Surgical  Other  Topics Concern   Not on file  Social History Narrative   Not on file   Social Determinants of Health   Financial Resource Strain: Not on file  Food Insecurity: Not on file  Transportation Needs: Not on file  Physical Activity: Not on file  Stress: Not on file  Social Connections: Not on file  Intimate Partner Violence: Not on file    Family History  Problem Relation Age of Onset   Diabetes Mother    Hypertension Mother    Hypertension Father    Aneurysm Father        aortic   Hypertension Sister    Heart attack Maternal Grandmother    Heart failure Maternal Grandfather    Heart attack Paternal Grandmother    Cancer Paternal Grandfather        not sure   Breast cancer Neg Hx     Allergies  Allergen Reactions   Lisinopril Hives    Review of Systems  Neurological:  Positive for headaches.  All other systems reviewed and are negative.      Objective:   BP (!) 160/84   Pulse 81   Ht 5' 7"$  (1.702 m)   Wt 286 lb 12.8 oz (130.1 kg)   SpO2 96%   BMI 44.92 kg/m   Vitals:   04/26/22 1427  BP: (!) 160/84  Pulse: 81  Height: 5' 7"$  (1.702  m)  Weight: 286 lb 12.8 oz (130.1 kg)  SpO2: 96%  BMI (Calculated): 44.91    Physical Exam Vitals and nursing note reviewed.  Constitutional:      Appearance: Normal appearance. She is obese.  HENT:     Head: Normocephalic.  Eyes:     Pupils: Pupils are equal, round, and reactive to light.  Cardiovascular:     Rate and Rhythm: Normal rate.  Pulmonary:     Effort: Pulmonary effort is normal.  Musculoskeletal:        General: Normal range of motion.     Cervical back: Normal range of motion.  Neurological:     General: No focal deficit present.     Mental Status: She is alert and oriented to person, place, and time.  Psychiatric:        Mood and Affect: Mood normal.        Behavior: Behavior normal.        Thought Content: Thought content normal.        Judgment: Judgment normal.      No results found for  any visits on 04/26/22.  No results found for this or any previous visit (from the past 2160 hour(s)).    Assessment & Plan:   Problem List Items Addressed This Visit     Migraine without aura and without status migrainosus, not intractable    Sending RX for Ubrelvy for pt.  Pt. States that this helped previously.         Essential hypertension - Primary    Pt. Will take BP at home daily for the next 2 weeks.  She will bring these with her to her follow up appointment.   If still elevated at that point, we will add amlodipine.         Vaginal mass   BMI 40.0-44.9, adult [facility])    Patient would like to try zenpep. I will see if I can get the RX coverage entered into the system so I can look to see if this will be covered.         Anxiety   Postprocedural hypothyroidism    Putting in labwork, patient states that she will come in to have these drawn fasting.   Return in about 2 weeks (around 05/10/2022).   Total time spent: 30 minutes  Mechele Claude, FNP  04/26/2022

## 2022-04-26 NOTE — Assessment & Plan Note (Signed)
Pt. Will take BP at home daily for the next 2 weeks.  She will bring these with her to her follow up appointment.   If still elevated at that point, we will add amlodipine.

## 2022-04-26 NOTE — Assessment & Plan Note (Signed)
Sending RX for [employer] for pt.  Pt. States that this helped previously.

## 2022-04-29 ENCOUNTER — Other Ambulatory Visit: Payer: Self-pay | Admitting: Family

## 2022-04-29 ENCOUNTER — Other Ambulatory Visit: Payer: Managed Care, Other (non HMO)

## 2022-04-30 LAB — COMPREHENSIVE METABOLIC PANEL
ALT: 7 IU/L (ref 0–32)
AST: 14 IU/L (ref 0–40)
Albumin/Globulin Ratio: 1.3 (ref 1.2–2.2)
Albumin: 4.3 g/dL (ref 3.8–4.9)
Alkaline Phosphatase: 113 IU/L (ref 44–121)
BUN/Creatinine Ratio: 14 (ref 9–23)
BUN: 13 mg/dL (ref 6–24)
Bilirubin Total: 0.5 mg/dL (ref 0.0–1.2)
CO2: 26 mmol/L (ref 20–29)
Calcium: 9.5 mg/dL (ref 8.7–10.2)
Chloride: 99 mmol/L (ref 96–106)
Creatinine, Ser: 0.9 mg/dL (ref 0.57–1.00)
Globulin, Total: 3.4 g/dL (ref 1.5–4.5)
Glucose: 90 mg/dL (ref 70–99)
Potassium: 4.7 mmol/L (ref 3.5–5.2)
Sodium: 139 mmol/L (ref 134–144)
Total Protein: 7.7 g/dL (ref 6.0–8.5)
eGFR: 74 mL/min/{1.73_m2} (ref >59)

## 2022-04-30 LAB — TSH: TSH: 3.58 u[IU]/mL (ref 0.450–4.500)

## 2022-04-30 LAB — CBC WITH DIFFERENTIAL/PLATELET
Basophils Absolute: 0 10*3/uL (ref 0.0–0.2)
Basos: 0 %
EOS (ABSOLUTE): 0 10*3/uL (ref 0.0–0.4)
Eos: 0 %
Hematocrit: 40.4 % (ref 34.0–46.6)
Hemoglobin: 13 g/dL (ref 11.1–15.9)
Immature Grans (Abs): 0 10*3/uL (ref 0.0–0.1)
Immature Granulocytes: 0 %
Lymphocytes Absolute: 2.2 10*3/uL (ref 0.7–3.1)
Lymphs: 31 %
MCH: 28.6 pg (ref 26.6–33.0)
MCHC: 32.2 g/dL (ref 31.5–35.7)
MCV: 89 fL (ref 79–97)
Monocytes Absolute: 0.4 10*3/uL (ref 0.1–0.9)
Monocytes: 6 %
Neutrophils Absolute: 4.4 10*3/uL (ref 1.4–7.0)
Neutrophils: 63 %
Platelets: 385 10*3/uL (ref 150–450)
RBC: 4.55 x10E6/uL (ref 3.77–5.28)
RDW: 13.5 % (ref 11.7–15.4)
WBC: 7 10*3/uL (ref 3.4–10.8)

## 2022-04-30 LAB — LIPID PANEL W/O CHOL/HDL RATIO
Cholesterol, Total: 197 mg/dL (ref 100–199)
HDL: 50 mg/dL (ref >39)
LDL Chol Calc (NIH): 133 mg/dL — ABNORMAL HIGH (ref 0–99)
Triglycerides: 78 mg/dL (ref 0–149)
VLDL Cholesterol Cal: 14 mg/dL (ref 5–40)

## 2022-04-30 LAB — VITAMIN D 25 HYDROXY (VIT D DEFICIENCY, FRACTURES): Vit D, 25-Hydroxy: 35.3 ng/mL (ref 30.0–100.0)

## 2022-04-30 LAB — HGB A1C W/O EAG: Hgb A1c MFr Bld: 5.4 % (ref 4.8–5.6)

## 2022-05-06 ENCOUNTER — Encounter: Payer: Self-pay | Admitting: Family

## 2022-05-10 ENCOUNTER — Ambulatory Visit: Payer: Managed Care, Other (non HMO) | Admitting: Family

## 2022-05-18 ENCOUNTER — Ambulatory Visit: Payer: Managed Care, Other (non HMO) | Admitting: Family

## 2022-05-21 ENCOUNTER — Other Ambulatory Visit: Payer: Self-pay

## 2022-05-21 ENCOUNTER — Ambulatory Visit: Payer: Managed Care, Other (non HMO) | Admitting: Family

## 2022-05-21 VITALS — BP 132/70 | HR 88 | Ht 67.0 in | Wt 283.0 lb

## 2022-05-21 DIAGNOSIS — I10 Essential (primary) hypertension: Secondary | ICD-10-CM | POA: Diagnosis not present

## 2022-05-21 DIAGNOSIS — Z6841 Body Mass Index (BMI) 40.0 and over, adult: Secondary | ICD-10-CM | POA: Diagnosis not present

## 2022-05-21 MED ORDER — SEMAGLUTIDE-WEIGHT MANAGEMENT 0.25 MG/0.5ML ~~LOC~~ SOAJ
0.2500 mg | SUBCUTANEOUS | 0 refills | Status: AC
  Filled 2022-05-21 – 2022-05-24 (×2): qty 2, 28d supply, fill #0

## 2022-05-21 MED ORDER — SEMAGLUTIDE-WEIGHT MANAGEMENT 0.5 MG/0.5ML ~~LOC~~ SOAJ
0.5000 mg | SUBCUTANEOUS | 0 refills | Status: AC
  Filled 2022-05-21 – 2022-06-28 (×2): qty 2, 28d supply, fill #0

## 2022-05-21 MED ORDER — SEMAGLUTIDE-WEIGHT MANAGEMENT 1 MG/0.5ML ~~LOC~~ SOAJ
1.0000 mg | SUBCUTANEOUS | 0 refills | Status: AC
  Filled 2022-05-21 – 2022-07-30 (×3): qty 2, 28d supply, fill #0

## 2022-05-21 MED ORDER — SEMAGLUTIDE-WEIGHT MANAGEMENT 1.7 MG/0.75ML ~~LOC~~ SOAJ
1.7000 mg | SUBCUTANEOUS | 0 refills | Status: AC
  Filled 2022-05-21: qty 3, 28d supply, fill #0

## 2022-05-21 MED ORDER — SEMAGLUTIDE-WEIGHT MANAGEMENT 2.4 MG/0.75ML ~~LOC~~ SOAJ
2.4000 mg | SUBCUTANEOUS | 1 refills | Status: AC
  Filled 2022-05-21: qty 3, fill #0

## 2022-05-22 ENCOUNTER — Encounter: Payer: Self-pay | Admitting: Family

## 2022-05-22 NOTE — Progress Notes (Signed)
Established Patient Office Visit  Subjective:  Patient ID: Kristina Peck, female    DOB: 01/28/1961  Age: 62 y.o. MRN: [id number]  Chief Complaint  Patient presents with   Follow-up    2 week follow up    Patient is here today for her 2 week follow up.  She has been feeling well since last appointment.  Her blood pressure is much better today and she feels better overall.   She does have additional concerns to discuss today.  Dr. Ronnald Collum previously tried (as did we) to get her approved for a weight loss medication, but she was unable to find the prescription anywhere.   Labs are not due today. She does not need refills.   I have reviewed her active problem list, medication list, allergies, notes from last encounter for her appointment today.   Hypertension This is a chronic problem. The current episode started more than 1 year ago.   No other concerns at this time.   Past Medical History:  Diagnosis Date   Anginal pain ([place])    Anxiety    Arthritis    Breast hypertrophy in female 08/18/2016   Depression    Encounter for screening mammogram for breast cancer 09/26/2015   Hypertension    Hypothyroidism    Macromastia    Thyroid disease     Past Surgical History:  Procedure Laterality Date   ABDOMINAL HYSTERECTOMY     APPENDECTOMY     BREAST REDUCTION SURGERY Bilateral 09/26/2020   Procedure: BILATERAL MAMMARY REDUCTION  (BREAST);  Surgeon: [name], MD;  Location: [place];  Service: Plastics;  Laterality: Bilateral;  2 hours   COLON SURGERY     HEMATOMA EVACUATION Left 09/26/2020   Procedure: EVACUATION HEMATOMA LEFT BREAST;  Surgeon: [name], MD;  Location: [facility];  Service: Plastics;  Laterality: Left;   KNEE ARTHROSCOPY Right 07/10/2015   KNEE ARTHROSCOPY W/ MENISCAL REPAIR Right 05/13/2014   MEDIAL PARTIAL KNEE REPLACEMENT Right [id number]   TUBAL LIGATION Bilateral 06/09/2009   Bilateral salphingo-oophorectomy     Social History   Socioeconomic History   Marital status: Married    Spouse name: Not on file   Number of children: 2   Years of education: Not on file   Highest education level: Not on file  Occupational History   Occupation: Counselling psychologist   Tobacco Use   Smoking status: Former    Packs/day: 0.50    Years: 20.00    Additional pack years: 0.00    Total pack years: 10.00    Types: Cigarettes    Start date: 03/08/1988    Quit date: 03/08/2008    Years since quitting: 14.2   Smokeless tobacco: Never  Vaping Use   Vaping Use: Never used  Substance and Sexual Activity   Alcohol use: Yes    Alcohol/week: 0.0 standard drinks of alcohol    Comment: moderately   Drug use: No   Sexual activity: Yes    Partners: Male    Birth control/protection: Surgical  Other Topics Concern   Not on file  Social History Narrative   Not on file   Social Determinants of Health   Financial Resource Strain: Not on file  Food Insecurity: Not on file  Transportation Needs: Not on file  Physical Activity: Not on file  Stress: Not on file  Social Connections: Not on file  Intimate Partner Violence: Not on file    Family History  Problem Relation Age of Onset   Diabetes Mother    Hypertension Mother    Hypertension Father    Aneurysm Father        aortic   Hypertension Sister    Heart attack Maternal Grandmother    Heart failure Maternal Grandfather    Heart attack Paternal Grandmother    Cancer Paternal Grandfather        not sure   Breast cancer Neg Hx     Allergies  Allergen Reactions   Lisinopril Hives    Review of Systems  All other systems reviewed and are negative.      Objective:   BP 132/70   Pulse 88   Ht 5\' 7"  (1.702 m)   Wt 283 lb (128.4 kg)   SpO2 98%   BMI 44.32 kg/m   Vitals:   05/21/22 1519  BP: 132/70  Pulse: 88  Height: 5\' 7"  (1.702 m)  Weight: 283 lb (128.4 kg)  SpO2: 98%  BMI (Calculated): 44.31    Physical Exam Vitals and nursing  note reviewed.  Constitutional:      Appearance: Normal appearance. She is normal weight.  HENT:     Head: Normocephalic.  Eyes:     Pupils: Pupils are equal, round, and reactive to light.  Cardiovascular:     Rate and Rhythm: Normal rate.  Pulmonary:     Effort: Pulmonary effort is normal.  Neurological:     Mental Status: She is alert.      No results found for any visits on 05/21/22.  Recent Results (from the past 2160 hour(s))  CBC with Differential/Platelet     Status: None   Collection Time: 04/29/22  8:59 AM  Result Value Ref Range   WBC 7.0 3.4 - 10.8 x10E3/uL   RBC 4.55 3.77 - 5.28 x10E6/uL   Hemoglobin 13.0 11.1 - 15.9 g/dL   Hematocrit 40.4 34.0 - 46.6 %   MCV 89 79 - 97 fL   MCH 28.6 26.6 - 33.0 pg   MCHC 32.2 31.5 - 35.7 g/dL   RDW 13.5 11.7 - 15.4 %   Platelets 385 150 - 450 x10E3/uL   Neutrophils 63 Not Estab. %   Lymphs 31 Not Estab. %   Monocytes 6 Not Estab. %   Eos 0 Not Estab. %   Basos 0 Not Estab. %   Neutrophils Absolute 4.4 1.4 - 7.0 x10E3/uL   Lymphocytes Absolute 2.2 0.7 - 3.1 x10E3/uL   Monocytes Absolute 0.4 0.1 - 0.9 x10E3/uL   EOS (ABSOLUTE) 0.0 0.0 - 0.4 x10E3/uL   Basophils Absolute 0.0 0.0 - 0.2 x10E3/uL   Immature Granulocytes 0 Not Estab. %   Immature Grans (Abs) 0.0 0.0 - 0.1 x10E3/uL  Comprehensive metabolic panel     Status: None   Collection Time: 04/29/22  8:59 AM  Result Value Ref Range   Glucose 90 70 - 99 mg/dL   BUN 13 6 - 24 mg/dL   Creatinine, Ser 0.90 0.57 - 1.00 mg/dL   eGFR 74 >59 mL/min/1.73   BUN/Creatinine Ratio 14 9 - 23   Sodium 139 134 - 144 mmol/L   Potassium 4.7 3.5 - 5.2 mmol/L   Chloride 99 96 - 106 mmol/L   CO2 26 20 - 29 mmol/L   Calcium 9.5 8.7 - 10.2 mg/dL   Total Protein 7.7 6.0 - 8.5 g/dL   Albumin 4.3 3.8 - 4.9 g/dL   Globulin, Total 3.4 1.5 - 4.5 g/dL   Albumin/Globulin Ratio  1.3 1.2 - 2.2   Bilirubin Total 0.5 0.0 - 1.2 mg/dL   Alkaline Phosphatase 113 44 - 121 IU/L   AST 14 0 - 40 IU/L    ALT 7 0 - 32 IU/L  Lipid Panel w/o Chol/HDL Ratio     Status: Abnormal   Collection Time: 04/29/22  8:59 AM  Result Value Ref Range   Cholesterol, Total 197 100 - 199 mg/dL   Triglycerides 78 0 - 149 mg/dL   HDL 50 >39 mg/dL   VLDL Cholesterol Cal 14 5 - 40 mg/dL   LDL Chol Calc (NIH) 133 (H) 0 - 99 mg/dL  Hgb A1c w/o eAG     Status: None   Collection Time: 04/29/22  8:59 AM  Result Value Ref Range   Hgb A1c MFr Bld 5.4 4.8 - 5.6 %    Comment:          Prediabetes: 5.7 - 6.4          Diabetes: >6.4          Glycemic control for adults with diabetes: <7.0   TSH     Status: None   Collection Time: 04/29/22  8:59 AM  Result Value Ref Range   TSH 3.580 0.450 - 4.500 uIU/mL  VITAMIN D 25 Hydroxy (Vit-D Deficiency, Fractures)     Status: None   Collection Time: 04/29/22  8:59 AM  Result Value Ref Range   Vit D, 25-Hydroxy 35.3 30.0 - 100.0 ng/mL    Comment: Vitamin D deficiency has been defined by the [place] and an Endocrine Society practice guideline as a level of serum 25-OH vitamin D less than 20 ng/mL (1,2). The Endocrine Society went on to further define vitamin D insufficiency as a level between 21 and 29 ng/mL (2). 1. IOM (Institute of Medicine). 2010. Dietary reference    intakes for calcium and D. [place]: The    [employer]. 2. Holick MF, [place], Bischoff-Ferrari HA, et al.    Evaluation, treatment, and prevention of vitamin D    deficiency: an Endocrine Society clinical practice    guideline. JCEM. 2011 Jul; 96(7):1911-30.       Assessment & Plan:   Problem List Items Addressed This Visit     BMI 40.0-44.9, adult ([place]) - Primary   Relevant Medications   Semaglutide-Weight Management 0.25 MG/0.5ML SOAJ   Semaglutide-Weight Management 0.5 MG/0.5ML SOAJ (Start on 06/19/2022)   Semaglutide-Weight Management 1 MG/0.5ML SOAJ (Start on 07/18/2022)   Semaglutide-Weight Management 1.7 MG/0.75ML SOAJ (Start on 08/16/2022)    Semaglutide-Weight Management 2.4 MG/0.75ML SOAJ (Start on 09/14/2022)    Return in about 3 months (around 08/21/2022) for F/U.   Total time spent: 20 minutes  Mechele Claude, FNP  05/21/2022

## 2022-05-24 ENCOUNTER — Other Ambulatory Visit: Payer: Self-pay

## 2022-05-28 ENCOUNTER — Other Ambulatory Visit: Payer: Self-pay

## 2022-06-07 ENCOUNTER — Other Ambulatory Visit: Payer: Self-pay | Admitting: Family

## 2022-06-27 ENCOUNTER — Other Ambulatory Visit: Payer: Self-pay | Admitting: Family

## 2022-06-27 DIAGNOSIS — Z6841 Body Mass Index (BMI) 40.0 and over, adult: Secondary | ICD-10-CM

## 2022-06-28 ENCOUNTER — Other Ambulatory Visit: Payer: Self-pay

## 2022-07-26 ENCOUNTER — Other Ambulatory Visit: Payer: Self-pay

## 2022-07-27 ENCOUNTER — Other Ambulatory Visit: Payer: Self-pay

## 2022-07-29 ENCOUNTER — Other Ambulatory Visit: Payer: Self-pay

## 2022-07-30 ENCOUNTER — Other Ambulatory Visit: Payer: Self-pay

## 2022-08-09 ENCOUNTER — Other Ambulatory Visit: Payer: Self-pay

## 2022-08-09 MED ORDER — WEGOVY 0.5 MG/0.5ML ~~LOC~~ SOAJ
0.5000 mg | SUBCUTANEOUS | 0 refills | Status: DC
  Filled 2022-08-09: qty 2, 28d supply, fill #0

## 2022-08-10 ENCOUNTER — Other Ambulatory Visit: Payer: Self-pay

## 2022-08-10 MED ORDER — BUSPIRONE HCL 15 MG PO TABS: 15.0000 mg | ORAL_TABLET | Freq: Two times a day (BID) | ORAL | 1 refills | Status: DC

## 2022-08-10 MED ORDER — LOSARTAN POTASSIUM 100 MG PO TABS: 100.0000 mg | ORAL_TABLET | Freq: Every day | ORAL | 1 refills | Status: DC

## 2022-08-17 ENCOUNTER — Other Ambulatory Visit: Payer: Self-pay

## 2022-08-27 ENCOUNTER — Ambulatory Visit: Payer: Managed Care, Other (non HMO) | Admitting: Family

## 2022-09-22 ENCOUNTER — Encounter: Payer: Self-pay | Admitting: Family

## 2022-09-22 ENCOUNTER — Ambulatory Visit: Payer: Managed Care, Other (non HMO) | Admitting: Family

## 2022-09-22 VITALS — BP 140/90 | HR 72 | Ht 67.0 in | Wt 282.4 lb

## 2022-09-22 DIAGNOSIS — I10 Essential (primary) hypertension: Secondary | ICD-10-CM

## 2022-09-22 DIAGNOSIS — E789 Disorder of lipoprotein metabolism, unspecified: Secondary | ICD-10-CM | POA: Diagnosis not present

## 2022-09-22 DIAGNOSIS — D485 Neoplasm of uncertain behavior of skin: Secondary | ICD-10-CM | POA: Diagnosis not present

## 2022-09-22 DIAGNOSIS — R7303 Prediabetes: Secondary | ICD-10-CM

## 2022-09-22 DIAGNOSIS — Z6841 Body Mass Index (BMI) 40.0 and over, adult: Secondary | ICD-10-CM | POA: Diagnosis not present

## 2022-09-22 DIAGNOSIS — E559 Vitamin D deficiency, unspecified: Secondary | ICD-10-CM

## 2022-09-22 DIAGNOSIS — E538 Deficiency of other specified B group vitamins: Secondary | ICD-10-CM

## 2022-09-22 DIAGNOSIS — E890 Postprocedural hypothyroidism: Secondary | ICD-10-CM

## 2022-09-22 NOTE — Progress Notes (Signed)
Established Patient Office Visit  Subjective:  Patient ID: Kristina Peck, female    DOB: 10-Jun-1960  Age: 62 y.o. MRN: [id number]  Chief Complaint  Patient presents with   Follow-up    3 mo F/U    Patient is here today for her 3 months follow up.  She has been feeling fairly well since last appointment.   She does have additional concerns to discuss today.  Labs are due today. She needs refills.   I have reviewed her active problem list, medication list, allergies, notes from last encounter, lab results for her appointment today.  No other concerns at this time.   Past Medical History:  Diagnosis Date   Anginal pain (HCC)    Anxiety    Arthritis    Breast hypertrophy in female 08/18/2016   Depression    Encounter for screening mammogram for breast cancer 09/26/2015   Hypertension    Hypothyroidism    Macromastia    Thyroid disease     Past Surgical History:  Procedure Laterality Date   ABDOMINAL HYSTERECTOMY     APPENDECTOMY     BREAST REDUCTION SURGERY Bilateral 09/26/2020   Procedure: BILATERAL MAMMARY REDUCTION  (BREAST);  Surgeon: Allena Napoleon, MD;  Location: [facility];  Service: Plastics;  Laterality: Bilateral;  2 hours   COLON SURGERY     HEMATOMA EVACUATION Left 09/26/2020   Procedure: EVACUATION HEMATOMA LEFT BREAST;  Surgeon: Allena Napoleon, MD;  Location: [facility];  Service: Plastics;  Laterality: Left;   KNEE ARTHROSCOPY Right 07/10/2015   KNEE ARTHROSCOPY W/ MENISCAL REPAIR Right 05/13/2014   MEDIAL PARTIAL KNEE REPLACEMENT Right [id number]   TUBAL LIGATION Bilateral 06/09/2009   Bilateral salphingo-oophorectomy    Social History   Socioeconomic History   Marital status: Married    Spouse name: Not on file   Number of children: 2   Years of education: Not on file   Highest education level: Not on file  Occupational History   Occupation: Film/video editor   Tobacco Use   Smoking status: Former    Current  packs/day: 0.00    Average packs/day: 0.5 packs/day for 20.0 years (10.0 ttl pk-yrs)    Types: Cigarettes    Start date: 03/08/1988    Quit date: 03/08/2008    Years since quitting: 14.5   Smokeless tobacco: Never  Vaping Use   Vaping status: Never Used  Substance and Sexual Activity   Alcohol use: Yes    Alcohol/week: 0.0 standard drinks of alcohol    Comment: moderately   Drug use: No   Sexual activity: Yes    Partners: Male    Birth control/protection: Surgical  Other Topics Concern   Not on file  Social History Narrative   Not on file   Social Determinants of Health   Financial Resource Strain: Not on file  Food Insecurity: Not on file  Transportation Needs: Not on file  Physical Activity: Not on file  Stress: Not on file  Social Connections: Not on file  Intimate Partner Violence: Not on file    Family History  Problem Relation Age of Onset   Diabetes Mother    Hypertension Mother    Hypertension Father    Aneurysm Father        aortic   Hypertension Sister    Heart attack Maternal Grandmother    Heart failure Maternal Grandfather    Heart attack Paternal Grandmother    Cancer Paternal Grandfather  not sure   Breast cancer Neg Hx     Allergies  Allergen Reactions   Lisinopril Hives    Review of Systems  Skin:        Spot on her face that she is concerned about.  She has tried to get it to go away, but it always comes back.   All other systems reviewed and are negative.      Objective:   BP (!) 140/90   Pulse 72   Ht 5\' 7"  (1.702 m)   Wt 282 lb 6.4 oz (128.1 kg)   SpO2 99%   BMI 44.23 kg/m   Vitals:   09/22/22 0900  BP: (!) 140/90  Pulse: 72  Height: 5\' 7"  (1.702 m)  Weight: 282 lb 6.4 oz (128.1 kg)  SpO2: 99%  BMI (Calculated): 44.22    Physical Exam Vitals and nursing note reviewed.  Constitutional:      Appearance: Normal appearance. She is normal weight.  HENT:     Head: Normocephalic and atraumatic.     Right Ear:  Tympanic membrane and ear canal normal.  Eyes:     Extraocular Movements: Extraocular movements intact.     Conjunctiva/sclera: Conjunctivae normal.     Pupils: Pupils are equal, round, and reactive to light.  Cardiovascular:     Rate and Rhythm: Normal rate and regular rhythm.  Pulmonary:     Effort: Pulmonary effort is normal.  Musculoskeletal:     Right lower leg: Edema present.     Left lower leg: Edema present.  Neurological:     General: No focal deficit present.     Mental Status: She is alert and oriented to person, place, and time. Mental status is at baseline.      Results for orders placed or performed in visit on 09/22/22  Lipid panel  Result Value Ref Range   Cholesterol, Total 203 (H) 100 - 199 mg/dL   Triglycerides 81 0 - 149 mg/dL   HDL 57 >16 mg/dL   VLDL Cholesterol Cal 15 5 - 40 mg/dL   LDL Chol Calc (NIH) 109 (H) 0 - 99 mg/dL   Chol/HDL Ratio 3.6 0.0 - 4.4 ratio  VITAMIN D 25 Hydroxy (Vit-D Deficiency, Fractures)  Result Value Ref Range   Vit D, 25-Hydroxy 24.4 (L) 30.0 - 100.0 ng/mL  CBC With Differential  Result Value Ref Range   WBC 6.2 3.4 - 10.8 x10E3/uL   RBC 4.75 3.77 - 5.28 x10E6/uL   Hemoglobin 13.4 11.1 - 15.9 g/dL   Hematocrit 60.4 54.0 - 46.6 %   MCV 88 79 - 97 fL   MCH 28.2 26.6 - 33.0 pg   MCHC 32.1 31.5 - 35.7 g/dL   RDW 98.1 19.1 - 47.8 %   Neutrophils 65 Not Estab. %   Lymphs 27 Not Estab. %   Monocytes 6 Not Estab. %   Eos 1 Not Estab. %   Basos 1 Not Estab. %   Neutrophils Absolute 4.0 1.4 - 7.0 x10E3/uL   Lymphocytes Absolute 1.7 0.7 - 3.1 x10E3/uL   Monocytes Absolute 0.4 0.1 - 0.9 x10E3/uL   EOS (ABSOLUTE) 0.1 0.0 - 0.4 x10E3/uL   Basophils Absolute 0.0 0.0 - 0.2 x10E3/uL   Immature Granulocytes 0 Not Estab. %   Immature Grans (Abs) 0.0 0.0 - 0.1 x10E3/uL  CMP14+EGFR  Result Value Ref Range   Glucose 96 70 - 99 mg/dL   BUN 10 8 - 27 mg/dL   Creatinine, Ser 2.95 0.57 -  1.00 mg/dL   eGFR 75 >16 XW/RUE/4.54    BUN/Creatinine Ratio 11 (L) 12 - 28   Sodium 141 134 - 144 mmol/L   Potassium 4.2 3.5 - 5.2 mmol/L   Chloride 102 96 - 106 mmol/L   CO2 25 20 - 29 mmol/L   Calcium 9.7 8.7 - 10.3 mg/dL   Total Protein 7.6 6.0 - 8.5 g/dL   Albumin 4.2 3.8 - 4.9 g/dL   Globulin, Total 3.4 1.5 - 4.5 g/dL   Bilirubin Total 0.3 0.0 - 1.2 mg/dL   Alkaline Phosphatase 106 44 - 121 IU/L   AST 21 0 - 40 IU/L   ALT 12 0 - 32 IU/L  TSH  Result Value Ref Range   TSH 1.120 0.450 - 4.500 uIU/mL  Hemoglobin A1c  Result Value Ref Range   Hgb A1c MFr Bld 5.5 4.8 - 5.6 %   Est. average glucose Bld gHb Est-mCnc 111 mg/dL  Vitamin U98  Result Value Ref Range   Vitamin B-12 531 232 - 1,245 pg/mL    Recent Results (from the past 2160 hour(s))  Lipid panel     Status: Abnormal   Collection Time: 09/22/22  9:49 AM  Result Value Ref Range   Cholesterol, Total 203 (H) 100 - 199 mg/dL   Triglycerides 81 0 - 149 mg/dL   HDL 57 >11 mg/dL   VLDL Cholesterol Cal 15 5 - 40 mg/dL   LDL Chol Calc (NIH) 914 (H) 0 - 99 mg/dL   Chol/HDL Ratio 3.6 0.0 - 4.4 ratio    Comment:                                   T. Chol/HDL Ratio                                             Men  Women                               1/2 Avg.Risk  3.4    3.3                                   Avg.Risk  5.0    4.4                                2X Avg.Risk  9.6    7.1                                3X Avg.Risk 23.4   11.0   VITAMIN D 25 Hydroxy (Vit-D Deficiency, Fractures)     Status: Abnormal   Collection Time: 09/22/22  9:49 AM  Result Value Ref Range   Vit D, 25-Hydroxy 24.4 (L) 30.0 - 100.0 ng/mL    Comment: Vitamin D deficiency has been defined by the Institute of Medicine and an Endocrine Society practice guideline as a level of serum 25-OH vitamin D less than 20 ng/mL (1,2). The Endocrine Society went on to further define vitamin D insufficiency as a level between 21 and 29  ng/mL (2). 1. IOM (Institute of Medicine). 2010. Dietary  reference    intakes for calcium and D. [place]: The    [employer]. 2. Holick MF, Binkley Lyons, Bischoff-Ferrari HA, et al.    Evaluation, treatment, and prevention of vitamin D    deficiency: an Endocrine Society clinical practice    guideline. JCEM. 2011 Jul; 96(7):1911-30.   CBC With Differential     Status: None   Collection Time: 09/22/22  9:49 AM  Result Value Ref Range   WBC 6.2 3.4 - 10.8 x10E3/uL   RBC 4.75 3.77 - 5.28 x10E6/uL   Hemoglobin 13.4 11.1 - 15.9 g/dL   Hematocrit 16.1 09.6 - 46.6 %   MCV 88 79 - 97 fL   MCH 28.2 26.6 - 33.0 pg   MCHC 32.1 31.5 - 35.7 g/dL   RDW 04.5 40.9 - 81.1 %   Neutrophils 65 Not Estab. %   Lymphs 27 Not Estab. %   Monocytes 6 Not Estab. %   Eos 1 Not Estab. %   Basos 1 Not Estab. %   Neutrophils Absolute 4.0 1.4 - 7.0 x10E3/uL   Lymphocytes Absolute 1.7 0.7 - 3.1 x10E3/uL   Monocytes Absolute 0.4 0.1 - 0.9 x10E3/uL   EOS (ABSOLUTE) 0.1 0.0 - 0.4 x10E3/uL   Basophils Absolute 0.0 0.0 - 0.2 x10E3/uL   Immature Granulocytes 0 Not Estab. %   Immature Grans (Abs) 0.0 0.0 - 0.1 x10E3/uL    Comment: **Effective October 04, 2022, profile [id number] CBC/Differential**   (No Platelet) will be made non-orderable. [facility] Offers:   [id number] CBC With Differential/Platelet   CMP14+EGFR     Status: Abnormal   Collection Time: 09/22/22  9:49 AM  Result Value Ref Range   Glucose 96 70 - 99 mg/dL   BUN 10 8 - 27 mg/dL   Creatinine, Ser 9.56 0.57 - 1.00 mg/dL   eGFR 75 >21 HY/QMV/7.84   BUN/Creatinine Ratio 11 (L) 12 - 28   Sodium 141 134 - 144 mmol/L   Potassium 4.2 3.5 - 5.2 mmol/L   Chloride 102 96 - 106 mmol/L   CO2 25 20 - 29 mmol/L   Calcium 9.7 8.7 - 10.3 mg/dL   Total Protein 7.6 6.0 - 8.5 g/dL   Albumin 4.2 3.8 - 4.9 g/dL   Globulin, Total 3.4 1.5 - 4.5 g/dL   Bilirubin Total 0.3 0.0 - 1.2 mg/dL   Alkaline Phosphatase 106 44 - 121 IU/L   AST 21 0 - 40 IU/L   ALT 12 0 - 32 IU/L  TSH     Status: None   Collection Time:  09/22/22  9:49 AM  Result Value Ref Range   TSH 1.120 0.450 - 4.500 uIU/mL  Hemoglobin A1c     Status: None   Collection Time: 09/22/22  9:49 AM  Result Value Ref Range   Hgb A1c MFr Bld 5.5 4.8 - 5.6 %    Comment:          Prediabetes: 5.7 - 6.4          Diabetes: >6.4          Glycemic control for adults with diabetes: <7.0    Est. average glucose Bld gHb Est-mCnc 111 mg/dL  Vitamin O96     Status: None   Collection Time: 09/22/22  9:49 AM  Result Value Ref Range   Vitamin B-12 531 232 - 1,245 pg/mL       Assessment & Plan:   Problem  List Items Addressed This Visit       Active Problems   Essential hypertension    Blood pressure well controlled with current medications.  Continue current therapy.  Will reassess at follow up.        Relevant Orders   CMP14+EGFR (Completed)   BMI 40.0-44.9, adult (HCC)    Continue current meds.  Will adjust as needed based on results.  The patient is asked to make an attempt to improve diet and exercise patterns to aid in medical management of this problem. Addressed importance of increasing and maintaining water intake.        Relevant Orders   CMP14+EGFR (Completed)   Borderline high cholesterol    Checking labs today.  Continue current therapy for lipid control. Will modify as needed based on labwork results.        Relevant Orders   Lipid panel (Completed)   CMP14+EGFR (Completed)   Postprocedural hypothyroidism    Patient stable.  Well controlled with current therapy.   Continue current meds.        Relevant Orders   CMP14+EGFR (Completed)   TSH (Completed)   Other Visit Diagnoses     Neoplasm of uncertain behavior of skin of face    -  Primary   Relevant Orders   Ambulatory referral to Dermatology   CMP14+EGFR (Completed)   Vitamin D deficiency, unspecified       Checking labs today.  Will continue supplements as needed.   Relevant Orders   VITAMIN D 25 Hydroxy (Vit-D Deficiency, Fractures) (Completed)    CMP14+EGFR (Completed)   B12 deficiency due to diet       Checking labs today.  Will continue supplements as needed.   Relevant Orders   CBC With Differential (Completed)   CMP14+EGFR (Completed)   Vitamin B12 (Completed)   Prediabetes       Checking labs today Patient counseled on dietary choices and verbalized understanding.   Relevant Orders   Hemoglobin A1c (Completed)       Return in about 4 months (around 01/23/2023).   Total time spent: 20 minutes  Miki Kins, FNP  09/22/2022   This document may have been prepared by [facility] Voice Recognition software and as such may include unintentional dictation errors.

## 2022-09-23 LAB — CBC WITH DIFFERENTIAL
Basophils Absolute: 0 10*3/uL (ref 0.0–0.2)
Basos: 1 %
EOS (ABSOLUTE): 0.1 10*3/uL (ref 0.0–0.4)
Eos: 1 %
Hematocrit: 41.7 % (ref 34.0–46.6)
Hemoglobin: 13.4 g/dL (ref 11.1–15.9)
Immature Grans (Abs): 0 10*3/uL (ref 0.0–0.1)
Immature Granulocytes: 0 %
Lymphocytes Absolute: 1.7 10*3/uL (ref 0.7–3.1)
Lymphs: 27 %
MCH: 28.2 pg (ref 26.6–33.0)
MCHC: 32.1 g/dL (ref 31.5–35.7)
MCV: 88 fL (ref 79–97)
Monocytes Absolute: 0.4 10*3/uL (ref 0.1–0.9)
Monocytes: 6 %
Neutrophils Absolute: 4 10*3/uL (ref 1.4–7.0)
Neutrophils: 65 %
RBC: 4.75 x10E6/uL (ref 3.77–5.28)
RDW: 13.8 % (ref 11.7–15.4)
WBC: 6.2 10*3/uL (ref 3.4–10.8)

## 2022-09-23 LAB — CMP14+EGFR
ALT: 12 IU/L (ref 0–32)
AST: 21 IU/L (ref 0–40)
Albumin: 4.2 g/dL (ref 3.8–4.9)
Alkaline Phosphatase: 106 IU/L (ref 44–121)
BUN/Creatinine Ratio: 11 — ABNORMAL LOW (ref 12–28)
BUN: 10 mg/dL (ref 8–27)
Bilirubin Total: 0.3 mg/dL (ref 0.0–1.2)
CO2: 25 mmol/L (ref 20–29)
Calcium: 9.7 mg/dL (ref 8.7–10.3)
Chloride: 102 mmol/L (ref 96–106)
Creatinine, Ser: 0.88 mg/dL (ref 0.57–1.00)
Globulin, Total: 3.4 g/dL (ref 1.5–4.5)
Glucose: 96 mg/dL (ref 70–99)
Potassium: 4.2 mmol/L (ref 3.5–5.2)
Sodium: 141 mmol/L (ref 134–144)
Total Protein: 7.6 g/dL (ref 6.0–8.5)
eGFR: 75 mL/min/{1.73_m2} (ref >59)

## 2022-09-23 LAB — LIPID PANEL
Chol/HDL Ratio: 3.6 ratio (ref 0.0–4.4)
Cholesterol, Total: 203 mg/dL — ABNORMAL HIGH (ref 100–199)
HDL: 57 mg/dL (ref >39)
LDL Chol Calc (NIH): 131 mg/dL — ABNORMAL HIGH (ref 0–99)
Triglycerides: 81 mg/dL (ref 0–149)
VLDL Cholesterol Cal: 15 mg/dL (ref 5–40)

## 2022-09-23 LAB — HEMOGLOBIN A1C
Est. average glucose Bld gHb Est-mCnc: 111 mg/dL
Hgb A1c MFr Bld: 5.5 % (ref 4.8–5.6)

## 2022-09-23 LAB — VITAMIN D 25 HYDROXY (VIT D DEFICIENCY, FRACTURES): Vit D, 25-Hydroxy: 24.4 ng/mL — ABNORMAL LOW (ref 30.0–100.0)

## 2022-09-23 LAB — TSH: TSH: 1.12 u[IU]/mL (ref 0.450–4.500)

## 2022-09-23 LAB — VITAMIN B12: Vitamin B-12: 531 pg/mL (ref 232–1245)

## 2022-09-26 ENCOUNTER — Encounter: Payer: Self-pay | Admitting: Family

## 2022-09-26 NOTE — Assessment & Plan Note (Signed)
Blood pressure well controlled with current medications.  Continue current therapy.  Will reassess at follow up.  

## 2022-09-26 NOTE — Assessment & Plan Note (Signed)
Patient stable.  Well controlled with current therapy.   Continue current meds.  

## 2022-09-26 NOTE — Assessment & Plan Note (Signed)
Continue current meds.  Will adjust as needed based on results.  The patient is asked to make an attempt to improve diet and exercise patterns to aid in medical management of this problem. Addressed importance of increasing and maintaining water intake.   

## 2022-09-26 NOTE — Assessment & Plan Note (Signed)
Checking labs today.  Continue current therapy for lipid control. Will modify as needed based on labwork results.  

## 2022-10-13 ENCOUNTER — Other Ambulatory Visit: Payer: Self-pay | Admitting: Family

## 2022-10-13 DIAGNOSIS — Z1231 Encounter for screening mammogram for malignant neoplasm of breast: Secondary | ICD-10-CM

## 2022-10-15 ENCOUNTER — Other Ambulatory Visit: Payer: Self-pay

## 2022-10-15 MED ORDER — ROSUVASTATIN CALCIUM 5 MG PO TABS: 5.0000 mg | ORAL_TABLET | Freq: Every day | ORAL | 11 refills | Status: DC

## 2022-10-21 ENCOUNTER — Ambulatory Visit
Admission: RE | Admit: 2022-10-21 | Discharge: 2022-10-21 | Disposition: A | Discharge status: Home / Self Care | Payer: Managed Care, Other (non HMO) | Source: Ambulatory Visit | Attending: Family | Admitting: Family

## 2022-10-21 DIAGNOSIS — Z1231 Encounter for screening mammogram for malignant neoplasm of breast: Secondary | ICD-10-CM | POA: Insufficient documentation

## 2022-10-25 ENCOUNTER — Other Ambulatory Visit: Payer: Self-pay | Admitting: Family

## 2022-10-25 ENCOUNTER — Ambulatory Visit: Payer: Managed Care, Other (non HMO) | Admitting: Family

## 2022-10-25 VITALS — HR 71 | Ht 67.0 in | Wt 284.2 lb

## 2022-10-25 DIAGNOSIS — Z0000 Encounter for general adult medical examination without abnormal findings: Secondary | ICD-10-CM

## 2022-10-25 DIAGNOSIS — I10 Essential (primary) hypertension: Secondary | ICD-10-CM | POA: Diagnosis not present

## 2022-10-25 DIAGNOSIS — R921 Mammographic calcification found on diagnostic imaging of breast: Secondary | ICD-10-CM

## 2022-10-25 DIAGNOSIS — Z6841 Body Mass Index (BMI) 40.0 and over, adult: Secondary | ICD-10-CM | POA: Diagnosis not present

## 2022-10-25 DIAGNOSIS — R928 Other abnormal and inconclusive findings on diagnostic imaging of breast: Secondary | ICD-10-CM

## 2022-10-26 ENCOUNTER — Other Ambulatory Visit: Payer: Managed Care, Other (non HMO)

## 2022-10-26 ENCOUNTER — Ambulatory Visit (INDEPENDENT_AMBULATORY_CARE_PROVIDER_SITE_OTHER): Payer: Managed Care, Other (non HMO) | Admitting: Family

## 2022-10-26 VITALS — BP 140/98

## 2022-10-26 DIAGNOSIS — I10 Essential (primary) hypertension: Secondary | ICD-10-CM

## 2022-10-26 DIAGNOSIS — Z6841 Body Mass Index (BMI) 40.0 and over, adult: Secondary | ICD-10-CM

## 2022-10-26 DIAGNOSIS — E789 Disorder of lipoprotein metabolism, unspecified: Secondary | ICD-10-CM

## 2022-10-27 ENCOUNTER — Other Ambulatory Visit: Payer: Self-pay | Admitting: Family

## 2022-10-27 ENCOUNTER — Ambulatory Visit
Admission: RE | Admit: 2022-10-27 | Discharge: 2022-10-27 | Disposition: A | Discharge status: Home / Self Care | Payer: Managed Care, Other (non HMO) | Source: Ambulatory Visit | Attending: Family | Admitting: Family

## 2022-10-27 DIAGNOSIS — R921 Mammographic calcification found on diagnostic imaging of breast: Secondary | ICD-10-CM | POA: Diagnosis present

## 2022-10-27 DIAGNOSIS — R928 Other abnormal and inconclusive findings on diagnostic imaging of breast: Secondary | ICD-10-CM | POA: Diagnosis present

## 2022-10-27 LAB — CBC WITH DIFFERENTIAL/PLATELET
Basophils Absolute: 0.1 10*3/uL (ref 0.0–0.2)
Basos: 1 %
EOS (ABSOLUTE): 0.1 10*3/uL (ref 0.0–0.4)
Eos: 1 %
Hematocrit: 39.7 % (ref 34.0–46.6)
Hemoglobin: 12.7 g/dL (ref 11.1–15.9)
Immature Grans (Abs): 0 10*3/uL (ref 0.0–0.1)
Immature Granulocytes: 0 %
Lymphocytes Absolute: 2.2 10*3/uL (ref 0.7–3.1)
Lymphs: 32 %
MCH: 28.5 pg (ref 26.6–33.0)
MCHC: 32 g/dL (ref 31.5–35.7)
MCV: 89 fL (ref 79–97)
Monocytes Absolute: 0.4 10*3/uL (ref 0.1–0.9)
Monocytes: 6 %
Neutrophils Absolute: 4 10*3/uL (ref 1.4–7.0)
Neutrophils: 60 %
Platelets: 362 10*3/uL (ref 150–450)
RBC: 4.45 x10E6/uL (ref 3.77–5.28)
RDW: 13.7 % (ref 11.7–15.4)
WBC: 6.7 10*3/uL (ref 3.4–10.8)

## 2022-10-27 LAB — TSH: TSH: 0.848 u[IU]/mL (ref 0.450–4.500)

## 2022-10-27 LAB — LIPID PANEL
Chol/HDL Ratio: 3.8 ratio (ref 0.0–4.4)
Cholesterol, Total: 178 mg/dL (ref 100–199)
HDL: 47 mg/dL (ref >39)
LDL Chol Calc (NIH): 118 mg/dL — ABNORMAL HIGH (ref 0–99)
Triglycerides: 67 mg/dL (ref 0–149)
VLDL Cholesterol Cal: 13 mg/dL (ref 5–40)

## 2022-10-27 LAB — CMP14+EGFR
ALT: 10 IU/L (ref 0–32)
AST: 16 IU/L (ref 0–40)
Albumin: 3.9 g/dL (ref 3.8–4.9)
Alkaline Phosphatase: 105 IU/L (ref 44–121)
BUN/Creatinine Ratio: 10 — ABNORMAL LOW (ref 12–28)
BUN: 8 mg/dL (ref 8–27)
Bilirubin Total: 0.4 mg/dL (ref 0.0–1.2)
CO2: 24 mmol/L (ref 20–29)
Calcium: 9.1 mg/dL (ref 8.7–10.3)
Chloride: 103 mmol/L (ref 96–106)
Creatinine, Ser: 0.82 mg/dL (ref 0.57–1.00)
Globulin, Total: 2.9 g/dL (ref 1.5–4.5)
Glucose: 84 mg/dL (ref 70–99)
Potassium: 4.6 mmol/L (ref 3.5–5.2)
Sodium: 140 mmol/L (ref 134–144)
Total Protein: 6.8 g/dL (ref 6.0–8.5)
eGFR: 82 mL/min/{1.73_m2} (ref >59)

## 2022-10-27 LAB — HEMOGLOBIN A1C
Est. average glucose Bld gHb Est-mCnc: 114 mg/dL
Hgb A1c MFr Bld: 5.6 % (ref 4.8–5.6)

## 2022-10-28 NOTE — Progress Notes (Signed)
Spoke with pt who verbalized understanding.

## 2022-11-03 ENCOUNTER — Ambulatory Visit
Admission: RE | Admit: 2022-11-03 | Discharge: 2022-11-03 | Disposition: A | Discharge status: Home / Self Care | Payer: Managed Care, Other (non HMO) | Source: Ambulatory Visit | Attending: Family | Admitting: Family

## 2022-11-03 ENCOUNTER — Ambulatory Visit
Admission: RE | Admit: 2022-11-03 | Discharge: 2022-11-03 | Discharge status: Home / Self Care | Payer: Managed Care, Other (non HMO) | Source: Ambulatory Visit | Attending: Family | Admitting: Family

## 2022-11-03 DIAGNOSIS — R928 Other abnormal and inconclusive findings on diagnostic imaging of breast: Secondary | ICD-10-CM | POA: Insufficient documentation

## 2022-11-03 DIAGNOSIS — R921 Mammographic calcification found on diagnostic imaging of breast: Secondary | ICD-10-CM | POA: Insufficient documentation

## 2022-11-03 HISTORY — PX: BREAST BIOPSY: SHX20

## 2022-11-03 MED ORDER — LIDOCAINE 1 % OPTIME INJ - NO CHARGE
5.0000 mL | Freq: Once | INTRAMUSCULAR | Status: AC
  Administered 2022-11-03: 5 mL

## 2022-11-03 MED ORDER — LIDOCAINE-EPINEPHRINE 1 %-1:100000 IJ SOLN
20.0000 mL | Freq: Once | INTRAMUSCULAR | Status: AC
  Administered 2022-11-03: 20 mL

## 2022-11-06 NOTE — Assessment & Plan Note (Signed)
Patient stable.  Well controlled with current therapy.   Continue current meds.  

## 2022-11-06 NOTE — Progress Notes (Signed)
  BP Check Only    Subjective:  Patient ID: Kristina Peck, female    DOB: 1960/09/09  Age: 62 y.o. MRN: [id number]  Chief Complaint  Patient presents with   Hypertension    Bp Check.       Objective:   BP (!) 140/98    BP Readings from Last 3 Encounters:  10/26/22 (!) 140/98  09/22/22 (!) 140/90  05/21/22 132/70      Assessment & Plan:   Problem List Items Addressed This Visit       Active Problems   Essential hypertension - Primary    Patient stable.  Well controlled with current therapy.   Continue current meds.        Total time spent: 5 minutes

## 2022-11-08 ENCOUNTER — Encounter: Payer: Self-pay | Admitting: Family

## 2022-11-08 NOTE — Progress Notes (Signed)
Complete physical exam  Patient: Kristina Peck   DOB: 02-04-1961   62 y.o. Female  MRN: [id number]  Subjective:    Chief Complaint  Patient presents with   Follow-up    Wellness visit    Kristina Peck is a 62 y.o. female who presents today for a complete physical exam. She reports consuming a general diet.  She generally feels fairly well. She reports sleeping fairly well. She does have additional problems to discuss today.    Most recent fall risk assessment:    04/07/2018    8:03 AM  Fall Risk   Falls in the past year? 0     Most recent depression screenings:    04/07/2018    8:03 AM 11/14/2017    4:09 PM  PHQ 2/9 Scores  PHQ - 2 Score 0 4  PHQ- 9 Score 0 7    Past Medical History:  Diagnosis Date   Anginal pain (HCC)    Anxiety    Arthritis    Breast hypertrophy in female 08/18/2016   Depression    Encounter for screening mammogram for breast cancer 09/26/2015   Hypertension    Hypothyroidism    Macromastia    Thyroid disease     Past Surgical History:  Procedure Laterality Date   ABDOMINAL HYSTERECTOMY     APPENDECTOMY     BREAST BIOPSY Left 11/03/2022   lt stereo calcs ribbon clip path pending   BREAST BIOPSY Left 11/03/2022   MM LT BREAST BX W LOC DEV 1ST LESION IMAGE BX SPEC STEREO GUIDE 11/03/2022 [facility]   BREAST REDUCTION SURGERY Bilateral 09/26/2020   Procedure: BILATERAL MAMMARY REDUCTION  (BREAST);  Surgeon: Allena Napoleon, MD;  Location: [facility];  Service: Plastics;  Laterality: Bilateral;  2 hours   COLON SURGERY     HEMATOMA EVACUATION Left 09/26/2020   Procedure: EVACUATION HEMATOMA LEFT BREAST;  Surgeon: Allena Napoleon, MD;  Location:  SURGERY CENTER;  Service: Plastics;  Laterality: Left;   KNEE ARTHROSCOPY Right 07/10/2015   KNEE ARTHROSCOPY W/ MENISCAL REPAIR Right 05/13/2014   MEDIAL PARTIAL KNEE REPLACEMENT Right [id number]   REDUCTION MAMMAPLASTY     TUBAL LIGATION Bilateral 06/09/2009    Bilateral salphingo-oophorectomy    Family History  Problem Relation Age of Onset   Diabetes Mother    Hypertension Mother    Hypertension Father    Aneurysm Father        aortic   Hypertension Sister    Heart attack Maternal Grandmother    Heart failure Maternal Grandfather    Heart attack Paternal Grandmother    Cancer Paternal Grandfather        not sure   Breast cancer Neg Hx     Social History   Socioeconomic History   Marital status: Married    Spouse name: Not on file   Number of children: 2   Years of education: Not on file   Highest education level: Not on file  Occupational History   Occupation: Film/video editor   Tobacco Use   Smoking status: Former    Current packs/day: 0.00    Average packs/day: 0.5 packs/day for 20.0 years (10.0 ttl pk-yrs)    Types: Cigarettes    Start date: 03/08/1988    Quit date: 03/08/2008    Years since quitting: 14.6   Smokeless tobacco: Never  Vaping Use   Vaping status: Never Used  Substance and Sexual Activity   Alcohol use: Yes  Alcohol/week: 0.0 standard drinks of alcohol    Comment: moderately   Drug use: No   Sexual activity: Yes    Partners: Male    Birth control/protection: Surgical  Other Topics Concern   Not on file  Social History Narrative   Not on file   Social Determinants of Health   Financial Resource Strain: Not on file  Food Insecurity: Not on file  Transportation Needs: Not on file  Physical Activity: Not on file  Stress: Not on file  Social Connections: Not on file  Intimate Partner Violence: Not on file    Outpatient Medications Prior to Visit  Medication Sig   rosuvastatin (CRESTOR) 5 MG tablet Take 1 tablet (5 mg total) by mouth daily.   ARMOUR THYROID 180 MG tablet TAKE 1 TABLET BY MOUTH DAILY ON AN EMPTY STOMACH.   busPIRone (BUSPAR) 15 MG tablet Take 1 tablet (15 mg total) by mouth 2 (two) times daily.   desvenlafaxine (PRISTIQ) 100 MG 24 hr tablet TAKE 1 TABLET BY MOUTH EVERY DAY    FLUoxetine HCl (PROZAC PO) Take 100 mg by mouth.   hydrochlorothiazide (HYDRODIURIL) 25 MG tablet Take 1 tablet (25 mg total) by mouth daily.   LORazepam (ATIVAN) 0.5 MG tablet Take 0.5 mg by mouth daily as needed.   losartan (COZAAR) 100 MG tablet Take 1 tablet (100 mg total) by mouth daily.   ondansetron (ZOFRAN) 4 MG tablet Take 1 tablet (4 mg total) by mouth every 8 (eight) hours as needed for nausea or vomiting.   Semaglutide-Weight Management (WEGOVY) 0.5 MG/0.5ML SOAJ Inject 0.5 mg into the skin every 7 (seven) days.   Semaglutide-Weight Management 2.4 MG/0.75ML SOAJ Inject 2.4 mg into the skin once a week.   tirzepatide (ZEPBOUND) 2.5 MG/0.5ML Pen Inject 2.5 mg into the skin once a week.   traZODone (DESYREL) 50 MG tablet Take 0.5-1 tablets (25-50 mg total) by mouth at bedtime as needed. for sleep   Ubrogepant (UBRELVY) 100 MG TABS Take 1 tablet (100 mg total) by mouth daily as needed (for migraine. May repeat in 2 hours if not improved.).   Vitamin D, Ergocalciferol, (DRISDOL) 1.25 MG (50000 UNIT) CAPS capsule Take 50,000 Units by mouth once a week.   No facility-administered medications prior to visit.    Review of Systems  All other systems reviewed and are negative.       Objective:     Pulse 71   Ht 5\' 7"  (1.702 m)   Wt 284 lb 3.2 oz (128.9 kg)   SpO2 99%   BMI 44.51 kg/m    Physical Exam Vitals and nursing note reviewed.  Constitutional:      Appearance: Normal appearance. She is normal weight.  HENT:     Head: Normocephalic.  Eyes:     Extraocular Movements: Extraocular movements intact.     Conjunctiva/sclera: Conjunctivae normal.     Pupils: Pupils are equal, round, and reactive to light.  Cardiovascular:     Rate and Rhythm: Normal rate.  Pulmonary:     Effort: Pulmonary effort is normal.  Musculoskeletal:        General: Normal range of motion.     Cervical back: Normal range of motion.  Neurological:     General: No focal deficit present.      Mental Status: She is alert and oriented to person, place, and time. Mental status is at baseline.  Psychiatric:        Mood and Affect: Mood normal.  Behavior: Behavior normal.        Thought Content: Thought content normal.        Judgment: Judgment normal.      No results found for any visits on 10/25/22.  Recent Results (from the past 2160 hour(s))  Lipid panel     Status: Abnormal   Collection Time: 09/22/22  9:49 AM  Result Value Ref Range   Cholesterol, Total 203 (H) 100 - 199 mg/dL   Triglycerides 81 0 - 149 mg/dL   HDL 57 >40 mg/dL   VLDL Cholesterol Cal 15 5 - 40 mg/dL   LDL Chol Calc (NIH) 981 (H) 0 - 99 mg/dL   Chol/HDL Ratio 3.6 0.0 - 4.4 ratio    Comment:                                   T. Chol/HDL Ratio                                             Men  Women                               1/2 Avg.Risk  3.4    3.3                                   Avg.Risk  5.0    4.4                                2X Avg.Risk  9.6    7.1                                3X Avg.Risk 23.4   11.0   VITAMIN D 25 Hydroxy (Vit-D Deficiency, Fractures)     Status: Abnormal   Collection Time: 09/22/22  9:49 AM  Result Value Ref Range   Vit D, 25-Hydroxy 24.4 (L) 30.0 - 100.0 ng/mL    Comment: Vitamin D deficiency has been defined by the Institute of Medicine and an Endocrine Society practice guideline as a level of serum 25-OH vitamin D less than 20 ng/mL (1,2). The Endocrine Society went on to further define vitamin D insufficiency as a level between 21 and 29 ng/mL (2). 1. IOM (Institute of Medicine). 2010. Dietary reference    intakes for calcium and D. [place]: The    [employer]. 2. Holick MF, Binkley Maupin, Bischoff-Ferrari HA, et al.    Evaluation, treatment, and prevention of vitamin D    deficiency: an Endocrine Society clinical practice    guideline. JCEM. 2011 Jul; 96(7):1911-30.   CBC With Differential     Status: None   Collection Time: 09/22/22   9:49 AM  Result Value Ref Range   WBC 6.2 3.4 - 10.8 x10E3/uL   RBC 4.75 3.77 - 5.28 x10E6/uL   Hemoglobin 13.4 11.1 - 15.9 g/dL   Hematocrit 19.1 47.8 - 46.6 %   MCV 88 79 - 97 fL   MCH 28.2 26.6 - 33.0 pg   MCHC 32.1 31.5 - 35.7  g/dL   RDW 40.9 81.1 - 91.4 %   Neutrophils 65 Not Estab. %   Lymphs 27 Not Estab. %   Monocytes 6 Not Estab. %   Eos 1 Not Estab. %   Basos 1 Not Estab. %   Neutrophils Absolute 4.0 1.4 - 7.0 x10E3/uL   Lymphocytes Absolute 1.7 0.7 - 3.1 x10E3/uL   Monocytes Absolute 0.4 0.1 - 0.9 x10E3/uL   EOS (ABSOLUTE) 0.1 0.0 - 0.4 x10E3/uL   Basophils Absolute 0.0 0.0 - 0.2 x10E3/uL   Immature Granulocytes 0 Not Estab. %   Immature Grans (Abs) 0.0 0.0 - 0.1 x10E3/uL    Comment: **Effective October 04, 2022, profile [id number] CBC/Differential**   (No Platelet) will be made non-orderable. [facility] Offers:   [id number] CBC With Differential/Platelet   CMP14+EGFR     Status: Abnormal   Collection Time: 09/22/22  9:49 AM  Result Value Ref Range   Glucose 96 70 - 99 mg/dL   BUN 10 8 - 27 mg/dL   Creatinine, Ser 2.13 0.57 - 1.00 mg/dL   eGFR 75 >08 MV/HQI/6.96   BUN/Creatinine Ratio 11 (L) 12 - 28   Sodium 141 134 - 144 mmol/L   Potassium 4.2 3.5 - 5.2 mmol/L   Chloride 102 96 - 106 mmol/L   CO2 25 20 - 29 mmol/L   Calcium 9.7 8.7 - 10.3 mg/dL   Total Protein 7.6 6.0 - 8.5 g/dL   Albumin 4.2 3.8 - 4.9 g/dL   Globulin, Total 3.4 1.5 - 4.5 g/dL   Bilirubin Total 0.3 0.0 - 1.2 mg/dL   Alkaline Phosphatase 106 44 - 121 IU/L   AST 21 0 - 40 IU/L   ALT 12 0 - 32 IU/L  TSH     Status: None   Collection Time: 09/22/22  9:49 AM  Result Value Ref Range   TSH 1.120 0.450 - 4.500 uIU/mL  Hemoglobin A1c     Status: None   Collection Time: 09/22/22  9:49 AM  Result Value Ref Range   Hgb A1c MFr Bld 5.5 4.8 - 5.6 %    Comment:          Prediabetes: 5.7 - 6.4          Diabetes: >6.4          Glycemic control for adults with diabetes: <7.0    Est. average glucose Bld gHb  Est-mCnc 111 mg/dL  Vitamin E95     Status: None   Collection Time: 09/22/22  9:49 AM  Result Value Ref Range   Vitamin B-12 531 232 - 1,245 pg/mL  Hemoglobin A1c     Status: None   Collection Time: 10/26/22  8:48 AM  Result Value Ref Range   Hgb A1c MFr Bld 5.6 4.8 - 5.6 %    Comment:          Prediabetes: 5.7 - 6.4          Diabetes: >6.4          Glycemic control for adults with diabetes: <7.0    Est. average glucose Bld gHb Est-mCnc 114 mg/dL  TSH     Status: None   Collection Time: 10/26/22  8:48 AM  Result Value Ref Range   TSH 0.848 0.450 - 4.500 uIU/mL  CMP14+EGFR     Status: Abnormal   Collection Time: 10/26/22  8:48 AM  Result Value Ref Range   Glucose 84 70 - 99 mg/dL   BUN 8 8 - 27 mg/dL  Creatinine, Ser 0.82 0.57 - 1.00 mg/dL   eGFR 82 >16 XW/RUE/4.54   BUN/Creatinine Ratio 10 (L) 12 - 28   Sodium 140 134 - 144 mmol/L   Potassium 4.6 3.5 - 5.2 mmol/L   Chloride 103 96 - 106 mmol/L   CO2 24 20 - 29 mmol/L   Calcium 9.1 8.7 - 10.3 mg/dL   Total Protein 6.8 6.0 - 8.5 g/dL   Albumin 3.9 3.8 - 4.9 g/dL   Globulin, Total 2.9 1.5 - 4.5 g/dL   Bilirubin Total 0.4 0.0 - 1.2 mg/dL   Alkaline Phosphatase 105 44 - 121 IU/L   AST 16 0 - 40 IU/L   ALT 10 0 - 32 IU/L  Lipid panel     Status: Abnormal   Collection Time: 10/26/22  8:48 AM  Result Value Ref Range   Cholesterol, Total 178 100 - 199 mg/dL   Triglycerides 67 0 - 149 mg/dL   HDL 47 >09 mg/dL   VLDL Cholesterol Cal 13 5 - 40 mg/dL   LDL Chol Calc (NIH) 811 (H) 0 - 99 mg/dL   Chol/HDL Ratio 3.8 0.0 - 4.4 ratio    Comment:                                   T. Chol/HDL Ratio                                             Men  Women                               1/2 Avg.Risk  3.4    3.3                                   Avg.Risk  5.0    4.4                                2X Avg.Risk  9.6    7.1                                3X Avg.Risk 23.4   11.0   CBC with Differential/Platelet     Status: None   Collection  Time: 10/26/22  8:48 AM  Result Value Ref Range   WBC 6.7 3.4 - 10.8 x10E3/uL   RBC 4.45 3.77 - 5.28 x10E6/uL   Hemoglobin 12.7 11.1 - 15.9 g/dL   Hematocrit 91.4 78.2 - 46.6 %   MCV 89 79 - 97 fL   MCH 28.5 26.6 - 33.0 pg   MCHC 32.0 31.5 - 35.7 g/dL   RDW 95.6 21.3 - 08.6 %   Platelets 362 150 - 450 x10E3/uL   Neutrophils 60 Not Estab. %   Lymphs 32 Not Estab. %   Monocytes 6 Not Estab. %   Eos 1 Not Estab. %   Basos 1 Not Estab. %   Neutrophils Absolute 4.0 1.4 - 7.0 x10E3/uL   Lymphocytes Absolute 2.2 0.7 - 3.1 x10E3/uL   Monocytes Absolute 0.4 0.1 - 0.9 x10E3/uL  EOS (ABSOLUTE) 0.1 0.0 - 0.4 x10E3/uL   Basophils Absolute 0.1 0.0 - 0.2 x10E3/uL   Immature Granulocytes 0 Not Estab. %   Immature Grans (Abs) 0.0 0.0 - 0.1 x10E3/uL        Assessment & Plan:    Routine Health Maintenance and Physical Exam  Immunization History  Administered Date(s) Administered   Influenza,inj,Quad PF,6+ Mos 05/07/2016, 11/14/2017   Tdap 07/19/2017    Health Maintenance  Topic Date Due   COVID-19 Vaccine (1) Never done   Zoster Vaccines- Shingrix (1 of 2) Never done   PAP SMEAR-Modifier  12/17/2016   INFLUENZA VACCINE  01/06/2023 (Originally 10/07/2022)   MAMMOGRAM  10/21/2023   Colonoscopy  01/26/2024   DTaP/Tdap/Td (2 - Td or Tdap) 07/20/2027   Hepatitis C Screening  Completed   HIV Screening  Completed   HPV VACCINES  Aged Out    Discussed health benefits of physical activity, and encouraged her to engage in regular exercise appropriate for her age and condition.  Problem List Items Addressed This Visit       Active Problems   Essential hypertension    Blood pressure well controlled with current medications.  Continue current therapy.  Will reassess at follow up.       BMI 40.0-44.9, adult (HCC)    Continue current meds.  Will adjust as needed based on results.  The patient is asked to make an attempt to improve diet and exercise patterns to aid in medical management  of this problem. Addressed importance of increasing and maintaining water intake.        Other Visit Diagnoses     Routine general medical examination at health care facility    -  Primary   CPE completed today.  Labs done previously.      Return in about 3 months (around 01/25/2023).  Total time spent: 20 minutes  Miki Kins, FNP  10/25/2022   This document may have been prepared by [facility] Voice Recognition software and as such may include unintentional dictation errors.

## 2022-11-08 NOTE — Assessment & Plan Note (Signed)
Blood pressure well controlled with current medications.  Continue current therapy.  Will reassess at follow up.  

## 2022-11-08 NOTE — Assessment & Plan Note (Signed)
Continue current meds.  Will adjust as needed based on results.  The patient is asked to make an attempt to improve diet and exercise patterns to aid in medical management of this problem. Addressed importance of increasing and maintaining water intake.   

## 2022-11-11 ENCOUNTER — Other Ambulatory Visit: Payer: Self-pay

## 2022-11-16 ENCOUNTER — Ambulatory Visit (INDEPENDENT_AMBULATORY_CARE_PROVIDER_SITE_OTHER): Payer: Managed Care, Other (non HMO) | Admitting: Family

## 2022-11-16 ENCOUNTER — Encounter: Payer: Self-pay | Admitting: Family

## 2022-11-16 DIAGNOSIS — I10 Essential (primary) hypertension: Secondary | ICD-10-CM | POA: Diagnosis not present

## 2022-11-16 DIAGNOSIS — Z6841 Body Mass Index (BMI) 40.0 and over, adult: Secondary | ICD-10-CM | POA: Diagnosis not present

## 2022-11-16 MED ORDER — AMLODIPINE BESYLATE 2.5 MG PO TABS
2.5000 mg | ORAL_TABLET | Freq: Every day | ORAL | 0 refills | Status: DC
Start: 2022-11-16 — End: 2022-11-17

## 2022-11-16 MED ORDER — HYDROCHLOROTHIAZIDE 25 MG PO TABS
25.0000 mg | ORAL_TABLET | Freq: Every day | ORAL | 1 refills | Status: DC
Start: 2022-11-16 — End: 2023-08-29

## 2022-11-16 NOTE — Assessment & Plan Note (Signed)
Continue Losartan.  Start taking hydrochlorothiazide every day.  Add low dose amlodipine.

## 2022-11-16 NOTE — Progress Notes (Addendum)
Established Patient Office Visit  Subjective:  Patient ID: Kristina Peck, female    DOB: Jan 27, 1961  Age: 62 y.o. MRN: [id number]  Chief Complaint  Patient presents with   Follow-up    BP high    Patient is here today for her 1 month follow up.  She has been feeling about the same since last appointment.   She does have additional concerns to discuss today.   1) She has been continuing to have extremely high blood pressure. She has been taking her meds, but they have not been controlling it.  2) She has a severely negative reaction to the [facility], and had severe nausea and vomiting that lasted for several days. She has also used Phentermine in the past and it was ineffective.  Additionally, due to her blood pressure, this is a bad choice for her.  She has also tried Bupropion and saxenda in the past, which did not work either.  Labs are not due today. She needs refills.   I have reviewed her active problem list, medication list, allergies, notes from last encounter, lab results for her appointment today.    No other concerns at this time.   Past Medical History:  Diagnosis Date   Anginal pain (HCC)    Anxiety    Arthritis    Breast hypertrophy in female 08/18/2016   Depression    Encounter for screening mammogram for breast cancer 09/26/2015   Hypertension    Hypothyroidism    Macromastia    Thyroid disease     Past Surgical History:  Procedure Laterality Date   ABDOMINAL HYSTERECTOMY     APPENDECTOMY     BREAST BIOPSY Left 11/03/2022   lt stereo calcs ribbon clip path pending   BREAST BIOPSY Left 11/03/2022   MM LT BREAST BX W LOC DEV 1ST LESION IMAGE BX SPEC STEREO GUIDE 11/03/2022 [facility]   BREAST REDUCTION SURGERY Bilateral 09/26/2020   Procedure: BILATERAL MAMMARY REDUCTION  (BREAST);  Surgeon: Allena Napoleon, MD;  Location: [facility];  Service: Plastics;  Laterality: Bilateral;  2 hours   COLON SURGERY     HEMATOMA EVACUATION Left  09/26/2020   Procedure: EVACUATION HEMATOMA LEFT BREAST;  Surgeon: Allena Napoleon, MD;  Location: [facility];  Service: Plastics;  Laterality: Left;   KNEE ARTHROSCOPY Right 07/10/2015   KNEE ARTHROSCOPY W/ MENISCAL REPAIR Right 05/13/2014   MEDIAL PARTIAL KNEE REPLACEMENT Right [id number]   REDUCTION MAMMAPLASTY     TUBAL LIGATION Bilateral 06/09/2009   Bilateral salphingo-oophorectomy    Social History   Socioeconomic History   Marital status: Married    Spouse name: Not on file   Number of children: 2   Years of education: Not on file   Highest education level: Not on file  Occupational History   Occupation: Film/video editor   Tobacco Use   Smoking status: Former    Current packs/day: 0.00    Average packs/day: 0.5 packs/day for 20.0 years (10.0 ttl pk-yrs)    Types: Cigarettes    Start date: 03/08/1988    Quit date: 03/08/2008    Years since quitting: 14.7   Smokeless tobacco: Never  Vaping Use   Vaping status: Never Used  Substance and Sexual Activity   Alcohol use: Yes    Alcohol/week: 0.0 standard drinks of alcohol    Comment: moderately   Drug use: No   Sexual activity: Yes    Partners: Male    Birth control/protection: Surgical  Other Topics Concern   Not on file  Social History Narrative   Not on file   Social Determinants of Health   Financial Resource Strain: Not on file  Food Insecurity: Not on file  Transportation Needs: Not on file  Physical Activity: Not on file  Stress: Not on file  Social Connections: Not on file  Intimate Partner Violence: Not on file    Family History  Problem Relation Age of Onset   Diabetes Mother    Hypertension Mother    Hypertension Father    Aneurysm Father        aortic   Hypertension Sister    Heart attack Maternal Grandmother    Heart failure Maternal Grandfather    Heart attack Paternal Grandmother    Cancer Paternal Grandfather        not sure   Breast cancer Neg Hx     Allergies   Allergen Reactions   Lisinopril Hives    Review of Systems  Neurological:  Positive for dizziness and headaches.  All other systems reviewed and are negative.      Objective:   BP (!) 180/125   Pulse 91   Ht 5\' 7"  (1.702 m)   Wt 281 lb 9.6 oz (127.7 kg)   SpO2 97%   BMI 44.10 kg/m   Vitals:   11/16/22 1438  BP: (!) 180/125  Pulse: 91  Height: 5\' 7"  (1.702 m)  Weight: 281 lb 9.6 oz (127.7 kg)  SpO2: 97%  BMI (Calculated): 44.09    Physical Exam Vitals and nursing note reviewed.  Constitutional:      Appearance: Normal appearance. She is obese.  HENT:     Head: Normocephalic.  Eyes:     Extraocular Movements: Extraocular movements intact.     Conjunctiva/sclera: Conjunctivae normal.     Pupils: Pupils are equal, round, and reactive to light.  Cardiovascular:     Rate and Rhythm: Normal rate.  Pulmonary:     Effort: Pulmonary effort is normal.  Neurological:     General: No focal deficit present.     Mental Status: She is alert and oriented to person, place, and time. Mental status is at baseline.  Psychiatric:        Mood and Affect: Mood normal.        Behavior: Behavior normal.        Thought Content: Thought content normal.        Judgment: Judgment normal.      No results found for any visits on 11/16/22.  Recent Results (from the past 2160 hour(s))  Lipid panel     Status: Abnormal   Collection Time: 09/22/22  9:49 AM  Result Value Ref Range   Cholesterol, Total 203 (H) 100 - 199 mg/dL   Triglycerides 81 0 - 149 mg/dL   HDL 57 >16 mg/dL   VLDL Cholesterol Cal 15 5 - 40 mg/dL   LDL Chol Calc (NIH) 109 (H) 0 - 99 mg/dL   Chol/HDL Ratio 3.6 0.0 - 4.4 ratio    Comment:                                   T. Chol/HDL Ratio  Men  Women                               1/2 Avg.Risk  3.4    3.3                                   Avg.Risk  5.0    4.4                                2X Avg.Risk  9.6    7.1                                 3X Avg.Risk 23.4   11.0   VITAMIN D 25 Hydroxy (Vit-D Deficiency, Fractures)     Status: Abnormal   Collection Time: 09/22/22  9:49 AM  Result Value Ref Range   Vit D, 25-Hydroxy 24.4 (L) 30.0 - 100.0 ng/mL    Comment: Vitamin D deficiency has been defined by the Institute of Medicine and an Endocrine Society practice guideline as a level of serum 25-OH vitamin D less than 20 ng/mL (1,2). The Endocrine Society went on to further define vitamin D insufficiency as a level between 21 and 29 ng/mL (2). 1. IOM (Institute of Medicine). 2010. Dietary reference    intakes for calcium and D. [place]: The    [employer]. 2. Holick MF, Binkley [place], Bischoff-Ferrari HA, et al.    Evaluation, treatment, and prevention of vitamin D    deficiency: an Endocrine Society clinical practice    guideline. JCEM. 2011 Jul; 96(7):1911-30.   CBC With Differential     Status: None   Collection Time: 09/22/22  9:49 AM  Result Value Ref Range   WBC 6.2 3.4 - 10.8 x10E3/uL   RBC 4.75 3.77 - 5.28 x10E6/uL   Hemoglobin 13.4 11.1 - 15.9 g/dL   Hematocrit 62.1 30.8 - 46.6 %   MCV 88 79 - 97 fL   MCH 28.2 26.6 - 33.0 pg   MCHC 32.1 31.5 - 35.7 g/dL   RDW 65.7 84.6 - 96.2 %   Neutrophils 65 Not Estab. %   Lymphs 27 Not Estab. %   Monocytes 6 Not Estab. %   Eos 1 Not Estab. %   Basos 1 Not Estab. %   Neutrophils Absolute 4.0 1.4 - 7.0 x10E3/uL   Lymphocytes Absolute 1.7 0.7 - 3.1 x10E3/uL   Monocytes Absolute 0.4 0.1 - 0.9 x10E3/uL   EOS (ABSOLUTE) 0.1 0.0 - 0.4 x10E3/uL   Basophils Absolute 0.0 0.0 - 0.2 x10E3/uL   Immature Granulocytes 0 Not Estab. %   Immature Grans (Abs) 0.0 0.0 - 0.1 x10E3/uL    Comment: **Effective October 04, 2022, profile [id number] CBC/Differential**   (No Platelet) will be made non-orderable. [facility] Offers:   [id number] CBC With Differential/Platelet   CMP14+EGFR     Status: Abnormal   Collection Time: 09/22/22  9:49 AM  Result Value Ref Range    Glucose 96 70 - 99 mg/dL   BUN 10 8 - 27 mg/dL   Creatinine, Ser 3.24 0.57 - 1.00 mg/dL   eGFR 75 >40 NU/UVO/5.36   BUN/Creatinine Ratio 11 (L) 12 - 28   Sodium 141 134 - 144 mmol/L   Potassium  4.2 3.5 - 5.2 mmol/L   Chloride 102 96 - 106 mmol/L   CO2 25 20 - 29 mmol/L   Calcium 9.7 8.7 - 10.3 mg/dL   Total Protein 7.6 6.0 - 8.5 g/dL   Albumin 4.2 3.8 - 4.9 g/dL   Globulin, Total 3.4 1.5 - 4.5 g/dL   Bilirubin Total 0.3 0.0 - 1.2 mg/dL   Alkaline Phosphatase 106 44 - 121 IU/L   AST 21 0 - 40 IU/L   ALT 12 0 - 32 IU/L  TSH     Status: None   Collection Time: 09/22/22  9:49 AM  Result Value Ref Range   TSH 1.120 0.450 - 4.500 uIU/mL  Hemoglobin A1c     Status: None   Collection Time: 09/22/22  9:49 AM  Result Value Ref Range   Hgb A1c MFr Bld 5.5 4.8 - 5.6 %    Comment:          Prediabetes: 5.7 - 6.4          Diabetes: >6.4          Glycemic control for adults with diabetes: <7.0    Est. average glucose Bld gHb Est-mCnc 111 mg/dL  Vitamin G64     Status: None   Collection Time: 09/22/22  9:49 AM  Result Value Ref Range   Vitamin B-12 531 232 - 1,245 pg/mL  Hemoglobin A1c     Status: None   Collection Time: 10/26/22  8:48 AM  Result Value Ref Range   Hgb A1c MFr Bld 5.6 4.8 - 5.6 %    Comment:          Prediabetes: 5.7 - 6.4          Diabetes: >6.4          Glycemic control for adults with diabetes: <7.0    Est. average glucose Bld gHb Est-mCnc 114 mg/dL  TSH     Status: None   Collection Time: 10/26/22  8:48 AM  Result Value Ref Range   TSH 0.848 0.450 - 4.500 uIU/mL  CMP14+EGFR     Status: Abnormal   Collection Time: 10/26/22  8:48 AM  Result Value Ref Range   Glucose 84 70 - 99 mg/dL   BUN 8 8 - 27 mg/dL   Creatinine, Ser 4.03 0.57 - 1.00 mg/dL   eGFR 82 >47 QQ/VZD/6.38   BUN/Creatinine Ratio 10 (L) 12 - 28   Sodium 140 134 - 144 mmol/L   Potassium 4.6 3.5 - 5.2 mmol/L   Chloride 103 96 - 106 mmol/L   CO2 24 20 - 29 mmol/L   Calcium 9.1 8.7 - 10.3 mg/dL    Total Protein 6.8 6.0 - 8.5 g/dL   Albumin 3.9 3.8 - 4.9 g/dL   Globulin, Total 2.9 1.5 - 4.5 g/dL   Bilirubin Total 0.4 0.0 - 1.2 mg/dL   Alkaline Phosphatase 105 44 - 121 IU/L   AST 16 0 - 40 IU/L   ALT 10 0 - 32 IU/L  Lipid panel     Status: Abnormal   Collection Time: 10/26/22  8:48 AM  Result Value Ref Range   Cholesterol, Total 178 100 - 199 mg/dL   Triglycerides 67 0 - 149 mg/dL   HDL 47 >75 mg/dL   VLDL Cholesterol Cal 13 5 - 40 mg/dL   LDL Chol Calc (NIH) 643 (H) 0 - 99 mg/dL   Chol/HDL Ratio 3.8 0.0 - 4.4 ratio    Comment:  T. Chol/HDL Ratio                                             Men  Women                               1/2 Avg.Risk  3.4    3.3                                   Avg.Risk  5.0    4.4                                2X Avg.Risk  9.6    7.1                                3X Avg.Risk 23.4   11.0   CBC with Differential/Platelet     Status: None   Collection Time: 10/26/22  8:48 AM  Result Value Ref Range   WBC 6.7 3.4 - 10.8 x10E3/uL   RBC 4.45 3.77 - 5.28 x10E6/uL   Hemoglobin 12.7 11.1 - 15.9 g/dL   Hematocrit 29.5 62.1 - 46.6 %   MCV 89 79 - 97 fL   MCH 28.5 26.6 - 33.0 pg   MCHC 32.0 31.5 - 35.7 g/dL   RDW 30.8 65.7 - 84.6 %   Platelets 362 150 - 450 x10E3/uL   Neutrophils 60 Not Estab. %   Lymphs 32 Not Estab. %   Monocytes 6 Not Estab. %   Eos 1 Not Estab. %   Basos 1 Not Estab. %   Neutrophils Absolute 4.0 1.4 - 7.0 x10E3/uL   Lymphocytes Absolute 2.2 0.7 - 3.1 x10E3/uL   Monocytes Absolute 0.4 0.1 - 0.9 x10E3/uL   EOS (ABSOLUTE) 0.1 0.0 - 0.4 x10E3/uL   Basophils Absolute 0.1 0.0 - 0.2 x10E3/uL   Immature Granulocytes 0 Not Estab. %   Immature Grans (Abs) 0.0 0.0 - 0.1 x10E3/uL       Assessment & Plan:   Problem List Items Addressed This Visit       Active Problems   Hypertension, goal below 140/90    Continue Losartan.  Start taking hydrochlorothiazide every day.  Add low dose amlodipine.        Relevant Medications   hydrochlorothiazide (HYDRODIURIL) 25 MG tablet   amLODipine (NORVASC) 2.5 MG tablet   BMI 40.0-44.9, adult (HCC)    Despite the patient's attempts with weight watchers, phentermine, wegovy, saxenda, and over the counter options, she has been unable to lose weight.  She would like to try Zepbound, so we will see if we are able to get her prior authorization approved.         Return in about 2 weeks (around 11/30/2022) for F/U.   Total time spent: 20 minutes  Miki Kins, FNP  11/16/2022   This document may have been prepared by [facility] Voice Recognition software and as such may include unintentional dictation errors.

## 2022-11-16 NOTE — Assessment & Plan Note (Signed)
Despite the patient's attempts with weight watchers, phentermine, wegovy, saxenda, and over the counter options, she has been unable to lose weight.  She would like to try Zepbound, so we will see if we are able to get her prior authorization approved.

## 2022-11-17 ENCOUNTER — Other Ambulatory Visit: Payer: Self-pay

## 2022-11-17 DIAGNOSIS — I10 Essential (primary) hypertension: Secondary | ICD-10-CM

## 2022-11-17 MED ORDER — AMLODIPINE BESYLATE 2.5 MG PO TABS
2.5000 mg | ORAL_TABLET | Freq: Every day | ORAL | 0 refills | Status: AC
Start: 2022-11-17 — End: 2023-11-17

## 2022-11-29 ENCOUNTER — Other Ambulatory Visit: Payer: Self-pay

## 2022-11-29 MED ORDER — ZEPBOUND 2.5 MG/0.5ML ~~LOC~~ SOAJ
SUBCUTANEOUS | 0 refills | Status: DC
  Filled 2022-11-29: qty 2, 28d supply, fill #0

## 2022-12-03 ENCOUNTER — Ambulatory Visit (INDEPENDENT_AMBULATORY_CARE_PROVIDER_SITE_OTHER): Payer: Managed Care, Other (non HMO) | Admitting: Family

## 2022-12-03 ENCOUNTER — Encounter: Payer: Self-pay | Admitting: Family

## 2022-12-03 DIAGNOSIS — Z6841 Body Mass Index (BMI) 40.0 and over, adult: Secondary | ICD-10-CM

## 2022-12-03 DIAGNOSIS — I10 Essential (primary) hypertension: Secondary | ICD-10-CM

## 2022-12-03 MED ORDER — AMLODIPINE BESYLATE 5 MG PO TABS
5.0000 mg | ORAL_TABLET | Freq: Every day | ORAL | 0 refills | Status: DC
Start: 2022-12-03 — End: 2023-05-30

## 2022-12-03 NOTE — Progress Notes (Signed)
Established Patient Office Visit  Subjective:  Patient ID: Kristina Peck, female    DOB: 1960-11-26  Age: 62 y.o. MRN: [id number]  Chief Complaint  Patient presents with   Follow-up    2 week f/u    Patient is here for 2 week follow up.  She has been taking the amlodipine, and it has continued to be elevated.  Her blood pressure has improved, but it is still much higher than I would like for it to be.     No other concerns at this time.   Past Medical History:  Diagnosis Date   Anginal pain (HCC)    Anxiety    Arthritis    Breast hypertrophy in female 08/18/2016   Depression    Encounter for screening mammogram for breast cancer 09/26/2015   Hypertension    Hypothyroidism    Macromastia    Thyroid disease     Past Surgical History:  Procedure Laterality Date   ABDOMINAL HYSTERECTOMY     APPENDECTOMY     BREAST BIOPSY Left 11/03/2022   lt stereo calcs ribbon clip path pending   BREAST BIOPSY Left 11/03/2022   MM LT BREAST BX W LOC DEV 1ST LESION IMAGE BX SPEC STEREO GUIDE 11/03/2022 [facility]   BREAST REDUCTION SURGERY Bilateral 09/26/2020   Procedure: BILATERAL MAMMARY REDUCTION  (BREAST);  Surgeon: Allena Napoleon, MD;  Location: [facility];  Service: Plastics;  Laterality: Bilateral;  2 hours   COLON SURGERY     HEMATOMA EVACUATION Left 09/26/2020   Procedure: EVACUATION HEMATOMA LEFT BREAST;  Surgeon: Allena Napoleon, MD;  Location: [facility];  Service: Plastics;  Laterality: Left;   KNEE ARTHROSCOPY Right 07/10/2015   KNEE ARTHROSCOPY W/ MENISCAL REPAIR Right 05/13/2014   MEDIAL PARTIAL KNEE REPLACEMENT Right [id number]   REDUCTION MAMMAPLASTY     TUBAL LIGATION Bilateral 06/09/2009   Bilateral salphingo-oophorectomy    Social History   Socioeconomic History   Marital status: Married    Spouse name: Not on file   Number of children: 2   Years of education: Not on file   Highest education level: Not on file   Occupational History   Occupation: Film/video editor   Tobacco Use   Smoking status: Former    Current packs/day: 0.00    Average packs/day: 0.5 packs/day for 20.0 years (10.0 ttl pk-yrs)    Types: Cigarettes    Start date: 03/08/1988    Quit date: 03/08/2008    Years since quitting: 14.7   Smokeless tobacco: Never  Vaping Use   Vaping status: Never Used  Substance and Sexual Activity   Alcohol use: Yes    Alcohol/week: 0.0 standard drinks of alcohol    Comment: moderately   Drug use: No   Sexual activity: Yes    Partners: Male    Birth control/protection: Surgical  Other Topics Concern   Not on file  Social History Narrative   Not on file   Social Determinants of Health   Financial Resource Strain: Not on file  Food Insecurity: Not on file  Transportation Needs: Not on file  Physical Activity: Not on file  Stress: Not on file  Social Connections: Not on file  Intimate Partner Violence: Not on file    Family History  Problem Relation Age of Onset   Diabetes Mother    Hypertension Mother    Hypertension Father    Aneurysm Father        aortic  Hypertension Sister    Heart attack Maternal Grandmother    Heart failure Maternal Grandfather    Heart attack Paternal Grandmother    Cancer Paternal Grandfather        not sure   Breast cancer Neg Hx     Allergies  Allergen Reactions   Lisinopril Hives    Review of Systems  All other systems reviewed and are negative.      Objective:   BP (!) 160/99   Pulse 92   Ht 5\' 7"  (1.702 m)   Wt 279 lb 6.4 oz (126.7 kg)   SpO2 96%   BMI 43.76 kg/m   Vitals:   12/03/22 1511  BP: (!) 160/99  Pulse: 92  Height: 5\' 7"  (1.702 m)  Weight: 279 lb 6.4 oz (126.7 kg)  SpO2: 96%  BMI (Calculated): 43.75    Physical Exam Vitals and nursing note reviewed.  Constitutional:      Appearance: Normal appearance. She is normal weight.  HENT:     Head: Normocephalic.  Eyes:     Extraocular Movements: Extraocular  movements intact.     Conjunctiva/sclera: Conjunctivae normal.     Pupils: Pupils are equal, round, and reactive to light.  Cardiovascular:     Rate and Rhythm: Normal rate.  Pulmonary:     Effort: Pulmonary effort is normal.  Neurological:     General: No focal deficit present.     Mental Status: She is alert and oriented to person, place, and time. Mental status is at baseline.  Psychiatric:        Mood and Affect: Mood normal.        Behavior: Behavior normal.        Thought Content: Thought content normal.        Judgment: Judgment normal.      No results found for any visits on 12/03/22.  Recent Results (from the past 2160 hour(s))  Lipid panel     Status: Abnormal   Collection Time: 09/22/22  9:49 AM  Result Value Ref Range   Cholesterol, Total 203 (H) 100 - 199 mg/dL   Triglycerides 81 0 - 149 mg/dL   HDL 57 >27 mg/dL   VLDL Cholesterol Cal 15 5 - 40 mg/dL   LDL Chol Calc (NIH) 253 (H) 0 - 99 mg/dL   Chol/HDL Ratio 3.6 0.0 - 4.4 ratio    Comment:                                   T. Chol/HDL Ratio                                             Men  Women                               1/2 Avg.Risk  3.4    3.3                                   Avg.Risk  5.0    4.4  2X Avg.Risk  9.6    7.1                                3X Avg.Risk 23.4   11.0   VITAMIN D 25 Hydroxy (Vit-D Deficiency, Fractures)     Status: Abnormal   Collection Time: 09/22/22  9:49 AM  Result Value Ref Range   Vit D, 25-Hydroxy 24.4 (L) 30.0 - 100.0 ng/mL    Comment: Vitamin D deficiency has been defined by the Institute of Medicine and an Endocrine Society practice guideline as a level of serum 25-OH vitamin D less than 20 ng/mL (1,2). The Endocrine Society went on to further define vitamin D insufficiency as a level between 21 and 29 ng/mL (2). 1. IOM (Institute of Medicine). 2010. Dietary reference    intakes for calcium and D. [place]: The    [employer]. 2. Holick MF, Binkley [place], Bischoff-Ferrari HA, et al.    Evaluation, treatment, and prevention of vitamin D    deficiency: an Endocrine Society clinical practice    guideline. JCEM. 2011 Jul; 96(7):1911-30.   CBC With Differential     Status: None   Collection Time: 09/22/22  9:49 AM  Result Value Ref Range   WBC 6.2 3.4 - 10.8 x10E3/uL   RBC 4.75 3.77 - 5.28 x10E6/uL   Hemoglobin 13.4 11.1 - 15.9 g/dL   Hematocrit 16.1 09.6 - 46.6 %   MCV 88 79 - 97 fL   MCH 28.2 26.6 - 33.0 pg   MCHC 32.1 31.5 - 35.7 g/dL   RDW 04.5 40.9 - 81.1 %   Neutrophils 65 Not Estab. %   Lymphs 27 Not Estab. %   Monocytes 6 Not Estab. %   Eos 1 Not Estab. %   Basos 1 Not Estab. %   Neutrophils Absolute 4.0 1.4 - 7.0 x10E3/uL   Lymphocytes Absolute 1.7 0.7 - 3.1 x10E3/uL   Monocytes Absolute 0.4 0.1 - 0.9 x10E3/uL   EOS (ABSOLUTE) 0.1 0.0 - 0.4 x10E3/uL   Basophils Absolute 0.0 0.0 - 0.2 x10E3/uL   Immature Granulocytes 0 Not Estab. %   Immature Grans (Abs) 0.0 0.0 - 0.1 x10E3/uL    Comment: **Effective October 04, 2022, profile [id number] CBC/Differential**   (No Platelet) will be made non-orderable. [facility] Offers:   [id number] CBC With Differential/Platelet   CMP14+EGFR     Status: Abnormal   Collection Time: 09/22/22  9:49 AM  Result Value Ref Range   Glucose 96 70 - 99 mg/dL   BUN 10 8 - 27 mg/dL   Creatinine, Ser 9.56 0.57 - 1.00 mg/dL   eGFR 75 >21 HY/QMV/7.84   BUN/Creatinine Ratio 11 (L) 12 - 28   Sodium 141 134 - 144 mmol/L   Potassium 4.2 3.5 - 5.2 mmol/L   Chloride 102 96 - 106 mmol/L   CO2 25 20 - 29 mmol/L   Calcium 9.7 8.7 - 10.3 mg/dL   Total Protein 7.6 6.0 - 8.5 g/dL   Albumin 4.2 3.8 - 4.9 g/dL   Globulin, Total 3.4 1.5 - 4.5 g/dL   Bilirubin Total 0.3 0.0 - 1.2 mg/dL   Alkaline Phosphatase 106 44 - 121 IU/L   AST 21 0 - 40 IU/L   ALT 12 0 - 32 IU/L  TSH     Status: None   Collection Time: 09/22/22  9:49 AM  Result Value Ref Range   TSH 1.120  0.450 - 4.500  uIU/mL  Hemoglobin A1c     Status: None   Collection Time: 09/22/22  9:49 AM  Result Value Ref Range   Hgb A1c MFr Bld 5.5 4.8 - 5.6 %    Comment:          Prediabetes: 5.7 - 6.4          Diabetes: >6.4          Glycemic control for adults with diabetes: <7.0    Est. average glucose Bld gHb Est-mCnc 111 mg/dL  Vitamin Z61     Status: None   Collection Time: 09/22/22  9:49 AM  Result Value Ref Range   Vitamin B-12 531 232 - 1,245 pg/mL  Hemoglobin A1c     Status: None   Collection Time: 10/26/22  8:48 AM  Result Value Ref Range   Hgb A1c MFr Bld 5.6 4.8 - 5.6 %    Comment:          Prediabetes: 5.7 - 6.4          Diabetes: >6.4          Glycemic control for adults with diabetes: <7.0    Est. average glucose Bld gHb Est-mCnc 114 mg/dL  TSH     Status: None   Collection Time: 10/26/22  8:48 AM  Result Value Ref Range   TSH 0.848 0.450 - 4.500 uIU/mL  CMP14+EGFR     Status: Abnormal   Collection Time: 10/26/22  8:48 AM  Result Value Ref Range   Glucose 84 70 - 99 mg/dL   BUN 8 8 - 27 mg/dL   Creatinine, Ser 0.96 0.57 - 1.00 mg/dL   eGFR 82 >04 VW/UJW/1.19   BUN/Creatinine Ratio 10 (L) 12 - 28   Sodium 140 134 - 144 mmol/L   Potassium 4.6 3.5 - 5.2 mmol/L   Chloride 103 96 - 106 mmol/L   CO2 24 20 - 29 mmol/L   Calcium 9.1 8.7 - 10.3 mg/dL   Total Protein 6.8 6.0 - 8.5 g/dL   Albumin 3.9 3.8 - 4.9 g/dL   Globulin, Total 2.9 1.5 - 4.5 g/dL   Bilirubin Total 0.4 0.0 - 1.2 mg/dL   Alkaline Phosphatase 105 44 - 121 IU/L   AST 16 0 - 40 IU/L   ALT 10 0 - 32 IU/L  Lipid panel     Status: Abnormal   Collection Time: 10/26/22  8:48 AM  Result Value Ref Range   Cholesterol, Total 178 100 - 199 mg/dL   Triglycerides 67 0 - 149 mg/dL   HDL 47 >14 mg/dL   VLDL Cholesterol Cal 13 5 - 40 mg/dL   LDL Chol Calc (NIH) 782 (H) 0 - 99 mg/dL   Chol/HDL Ratio 3.8 0.0 - 4.4 ratio    Comment:                                   T. Chol/HDL Ratio                                              Men  Women                               1/2 Avg.Risk  3.4    3.3                                   Avg.Risk  5.0    4.4                                2X Avg.Risk  9.6    7.1                                3X Avg.Risk 23.4   11.0   CBC with Differential/Platelet     Status: None   Collection Time: 10/26/22  8:48 AM  Result Value Ref Range   WBC 6.7 3.4 - 10.8 x10E3/uL   RBC 4.45 3.77 - 5.28 x10E6/uL   Hemoglobin 12.7 11.1 - 15.9 g/dL   Hematocrit 24.4 01.0 - 46.6 %   MCV 89 79 - 97 fL   MCH 28.5 26.6 - 33.0 pg   MCHC 32.0 31.5 - 35.7 g/dL   RDW 27.2 53.6 - 64.4 %   Platelets 362 150 - 450 x10E3/uL   Neutrophils 60 Not Estab. %   Lymphs 32 Not Estab. %   Monocytes 6 Not Estab. %   Eos 1 Not Estab. %   Basos 1 Not Estab. %   Neutrophils Absolute 4.0 1.4 - 7.0 x10E3/uL   Lymphocytes Absolute 2.2 0.7 - 3.1 x10E3/uL   Monocytes Absolute 0.4 0.1 - 0.9 x10E3/uL   EOS (ABSOLUTE) 0.1 0.0 - 0.4 x10E3/uL   Basophils Absolute 0.1 0.0 - 0.2 x10E3/uL   Immature Granulocytes 0 Not Estab. %   Immature Grans (Abs) 0.0 0.0 - 0.1 x10E3/uL       Assessment & Plan:   Problem List Items Addressed This Visit       Active Problems   Hypertension, goal below 140/90    Increasing amlodipine dose.  Instructed pt to take 2 tablets daily.   Will recheck in 2 weeks.       Relevant Medications   amLODipine (NORVASC) 5 MG tablet   BMI 40.0-44.9, adult (HCC)    Continue current meds.  Will adjust as needed based on results.  The patient is asked to make an attempt to improve diet and exercise patterns to aid in medical management of this problem. Addressed importance of increasing and maintaining water intake.         Return in about 2 weeks (around 12/17/2022) for F/U.   Total time spent: 20 minutes  Miki Kins, FNP  12/03/2022   This document may have been prepared by [facility] Voice Recognition software and as such may include unintentional dictation errors. Marland Kitchen

## 2022-12-03 NOTE — Assessment & Plan Note (Signed)
Increasing amlodipine dose.  Instructed pt to take 2 tablets daily.   Will recheck in 2 weeks.

## 2022-12-03 NOTE — Assessment & Plan Note (Signed)
Continue current meds.  Will adjust as needed based on results.  The patient is asked to make an attempt to improve diet and exercise patterns to aid in medical management of this problem. Addressed importance of increasing and maintaining water intake.   

## 2022-12-17 ENCOUNTER — Ambulatory Visit: Payer: Managed Care, Other (non HMO) | Admitting: Family

## 2022-12-17 ENCOUNTER — Encounter: Payer: Self-pay | Admitting: Family

## 2022-12-17 VITALS — BP 138/80 | HR 90 | Ht 67.0 in | Wt 276.0 lb

## 2022-12-17 DIAGNOSIS — I10 Essential (primary) hypertension: Secondary | ICD-10-CM | POA: Diagnosis not present

## 2022-12-17 DIAGNOSIS — Z6841 Body Mass Index (BMI) 40.0 and over, adult: Secondary | ICD-10-CM | POA: Diagnosis not present

## 2022-12-17 MED ORDER — PROPRANOLOL HCL 10 MG PO TABS: 10.0000 mg | ORAL_TABLET | Freq: Two times a day (BID) | ORAL | 11 refills | Status: DC | PRN

## 2022-12-19 ENCOUNTER — Encounter: Payer: Self-pay | Admitting: Family

## 2022-12-19 NOTE — Assessment & Plan Note (Signed)
Blood pressure well controlled with current medications.  Continue current therapy.  Will reassess at follow up.  

## 2022-12-19 NOTE — Assessment & Plan Note (Signed)
Continue current meds.  Will adjust as needed based on results.  The patient is asked to make an attempt to improve diet and exercise patterns to aid in medical management of this problem. Addressed importance of increasing and maintaining water intake.   

## 2022-12-19 NOTE — Progress Notes (Signed)
Established Patient Office Visit  Subjective:  Patient ID: KYLIEGH JESTER, female    DOB: 04/05/1960  Age: 62 y.o. MRN: [id number]  Chief Complaint  Patient presents with   Follow-up    Patient here today for 2 week follow up of her blood pressure.  Numbers today are much better, despite continued issues with stress at work.  She says that she had a meeting with her boss prior to coming in today, but her blood pressure is still well controlled.   She is also doing well with her meds.   No other concerns at this time.   Past Medical History:  Diagnosis Date   Anginal pain (HCC)    Anxiety    Arthritis    Breast hypertrophy in female 08/18/2016   Depression    Encounter for screening mammogram for breast cancer 09/26/2015   Hypertension    Hypothyroidism    Macromastia    Thyroid disease     Past Surgical History:  Procedure Laterality Date   ABDOMINAL HYSTERECTOMY     APPENDECTOMY     BREAST BIOPSY Left 11/03/2022   lt stereo calcs ribbon clip path pending   BREAST BIOPSY Left 11/03/2022   MM LT BREAST BX W LOC DEV 1ST LESION IMAGE BX SPEC STEREO GUIDE 11/03/2022 [facility]   BREAST REDUCTION SURGERY Bilateral 09/26/2020   Procedure: BILATERAL MAMMARY REDUCTION  (BREAST);  Surgeon: Allena Napoleon, MD;  Location: [facility];  Service: Plastics;  Laterality: Bilateral;  2 hours   COLON SURGERY     HEMATOMA EVACUATION Left 09/26/2020   Procedure: EVACUATION HEMATOMA LEFT BREAST;  Surgeon: Allena Napoleon, MD;  Location: [facility];  Service: Plastics;  Laterality: Left;   KNEE ARTHROSCOPY Right 07/10/2015   KNEE ARTHROSCOPY W/ MENISCAL REPAIR Right 05/13/2014   MEDIAL PARTIAL KNEE REPLACEMENT Right [id number]   REDUCTION MAMMAPLASTY     TUBAL LIGATION Bilateral 06/09/2009   Bilateral salphingo-oophorectomy    Social History   Socioeconomic History   Marital status: Married    Spouse name: Not on file   Number of children: 2    Years of education: Not on file   Highest education level: Not on file  Occupational History   Occupation: Film/video editor   Tobacco Use   Smoking status: Former    Current packs/day: 0.00    Average packs/day: 0.5 packs/day for 20.0 years (10.0 ttl pk-yrs)    Types: Cigarettes    Start date: 03/08/1988    Quit date: 03/08/2008    Years since quitting: 14.7   Smokeless tobacco: Never  Vaping Use   Vaping status: Never Used  Substance and Sexual Activity   Alcohol use: Yes    Alcohol/week: 0.0 standard drinks of alcohol    Comment: moderately   Drug use: No   Sexual activity: Yes    Partners: Male    Birth control/protection: Surgical  Other Topics Concern   Not on file  Social History Narrative   Not on file   Social Determinants of Health   Financial Resource Strain: Not on file  Food Insecurity: Not on file  Transportation Needs: Not on file  Physical Activity: Not on file  Stress: Not on file  Social Connections: Not on file  Intimate Partner Violence: Not on file    Family History  Problem Relation Age of Onset   Diabetes Mother    Hypertension Mother    Hypertension Father    Aneurysm Father  aortic   Hypertension Sister    Heart attack Maternal Grandmother    Heart failure Maternal Grandfather    Heart attack Paternal Grandmother    Cancer Paternal Grandfather        not sure   Breast cancer Neg Hx     Allergies  Allergen Reactions   Lisinopril Hives    Review of Systems  All other systems reviewed and are negative.      Objective:   BP 138/80   Pulse 90   Ht 5\' 7"  (1.702 m)   Wt 276 lb (125.2 kg)   SpO2 97%   BMI 43.23 kg/m   Vitals:   12/17/22 1429  BP: 138/80  Pulse: 90  Height: 5\' 7"  (1.702 m)  Weight: 276 lb (125.2 kg)  SpO2: 97%  BMI (Calculated): 43.22    Physical Exam Vitals and nursing note reviewed.  Constitutional:      Appearance: Normal appearance. She is normal weight.  HENT:     Head: Normocephalic.   Eyes:     Conjunctiva/sclera: Conjunctivae normal.     Pupils: Pupils are equal, round, and reactive to light.  Cardiovascular:     Rate and Rhythm: Normal rate.  Pulmonary:     Effort: Pulmonary effort is normal.  Neurological:     General: No focal deficit present.     Mental Status: She is alert and oriented to person, place, and time. Mental status is at baseline.  Psychiatric:        Mood and Affect: Mood normal.        Behavior: Behavior normal.        Thought Content: Thought content normal.        Judgment: Judgment normal.      No results found for any visits on 12/17/22.  Recent Results (from the past 2160 hour(s))  Lipid panel     Status: Abnormal   Collection Time: 09/22/22  9:49 AM  Result Value Ref Range   Cholesterol, Total 203 (H) 100 - 199 mg/dL   Triglycerides 81 0 - 149 mg/dL   HDL 57 >16 mg/dL   VLDL Cholesterol Cal 15 5 - 40 mg/dL   LDL Chol Calc (NIH) 109 (H) 0 - 99 mg/dL   Chol/HDL Ratio 3.6 0.0 - 4.4 ratio    Comment:                                   T. Chol/HDL Ratio                                             Men  Women                               1/2 Avg.Risk  3.4    3.3                                   Avg.Risk  5.0    4.4                                2X Avg.Risk  9.6  7.1                                3X Avg.Risk 23.4   11.0   VITAMIN D 25 Hydroxy (Vit-D Deficiency, Fractures)     Status: Abnormal   Collection Time: 09/22/22  9:49 AM  Result Value Ref Range   Vit D, 25-Hydroxy 24.4 (L) 30.0 - 100.0 ng/mL    Comment: Vitamin D deficiency has been defined by the Institute of Medicine and an Endocrine Society practice guideline as a level of serum 25-OH vitamin D less than 20 ng/mL (1,2). The Endocrine Society went on to further define vitamin D insufficiency as a level between 21 and 29 ng/mL (2). 1. IOM (Institute of Medicine). 2010. Dietary reference    intakes for calcium and D. [place]: The    [employer]. 2. Holick MF, Binkley Sedan, Bischoff-Ferrari HA, et al.    Evaluation, treatment, and prevention of vitamin D    deficiency: an Endocrine Society clinical practice    guideline. JCEM. 2011 Jul; 96(7):1911-30.   CBC With Differential     Status: None   Collection Time: 09/22/22  9:49 AM  Result Value Ref Range   WBC 6.2 3.4 - 10.8 x10E3/uL   RBC 4.75 3.77 - 5.28 x10E6/uL   Hemoglobin 13.4 11.1 - 15.9 g/dL   Hematocrit 40.9 81.1 - 46.6 %   MCV 88 79 - 97 fL   MCH 28.2 26.6 - 33.0 pg   MCHC 32.1 31.5 - 35.7 g/dL   RDW 91.4 78.2 - 95.6 %   Neutrophils 65 Not Estab. %   Lymphs 27 Not Estab. %   Monocytes 6 Not Estab. %   Eos 1 Not Estab. %   Basos 1 Not Estab. %   Neutrophils Absolute 4.0 1.4 - 7.0 x10E3/uL   Lymphocytes Absolute 1.7 0.7 - 3.1 x10E3/uL   Monocytes Absolute 0.4 0.1 - 0.9 x10E3/uL   EOS (ABSOLUTE) 0.1 0.0 - 0.4 x10E3/uL   Basophils Absolute 0.0 0.0 - 0.2 x10E3/uL   Immature Granulocytes 0 Not Estab. %   Immature Grans (Abs) 0.0 0.0 - 0.1 x10E3/uL    Comment: **Effective October 04, 2022, profile [id number] CBC/Differential**   (No Platelet) will be made non-orderable. [facility] Offers:   [id number] CBC With Differential/Platelet   CMP14+EGFR     Status: Abnormal   Collection Time: 09/22/22  9:49 AM  Result Value Ref Range   Glucose 96 70 - 99 mg/dL   BUN 10 8 - 27 mg/dL   Creatinine, Ser 5.78 0.57 - 1.00 mg/dL   eGFR 75 >46 NG/EXB/2.84   BUN/Creatinine Ratio 11 (L) 12 - 28   Sodium 141 134 - 144 mmol/L   Potassium 4.2 3.5 - 5.2 mmol/L   Chloride 102 96 - 106 mmol/L   CO2 25 20 - 29 mmol/L   Calcium 9.7 8.7 - 10.3 mg/dL   Total Protein 7.6 6.0 - 8.5 g/dL   Albumin 4.2 3.8 - 4.9 g/dL   Globulin, Total 3.4 1.5 - 4.5 g/dL   Bilirubin Total 0.3 0.0 - 1.2 mg/dL   Alkaline Phosphatase 106 44 - 121 IU/L   AST 21 0 - 40 IU/L   ALT 12 0 - 32 IU/L  TSH     Status: None   Collection Time: 09/22/22  9:49 AM  Result Value Ref Range   TSH 1.120 0.450 - 4.500 uIU/mL   Hemoglobin  A1c     Status: None   Collection Time: 09/22/22  9:49 AM  Result Value Ref Range   Hgb A1c MFr Bld 5.5 4.8 - 5.6 %    Comment:          Prediabetes: 5.7 - 6.4          Diabetes: >6.4          Glycemic control for adults with diabetes: <7.0    Est. average glucose Bld gHb Est-mCnc 111 mg/dL  Vitamin Z61     Status: None   Collection Time: 09/22/22  9:49 AM  Result Value Ref Range   Vitamin B-12 531 232 - 1,245 pg/mL  Hemoglobin A1c     Status: None   Collection Time: 10/26/22  8:48 AM  Result Value Ref Range   Hgb A1c MFr Bld 5.6 4.8 - 5.6 %    Comment:          Prediabetes: 5.7 - 6.4          Diabetes: >6.4          Glycemic control for adults with diabetes: <7.0    Est. average glucose Bld gHb Est-mCnc 114 mg/dL  TSH     Status: None   Collection Time: 10/26/22  8:48 AM  Result Value Ref Range   TSH 0.848 0.450 - 4.500 uIU/mL  CMP14+EGFR     Status: Abnormal   Collection Time: 10/26/22  8:48 AM  Result Value Ref Range   Glucose 84 70 - 99 mg/dL   BUN 8 8 - 27 mg/dL   Creatinine, Ser 0.96 0.57 - 1.00 mg/dL   eGFR 82 >04 VW/UJW/1.19   BUN/Creatinine Ratio 10 (L) 12 - 28   Sodium 140 134 - 144 mmol/L   Potassium 4.6 3.5 - 5.2 mmol/L   Chloride 103 96 - 106 mmol/L   CO2 24 20 - 29 mmol/L   Calcium 9.1 8.7 - 10.3 mg/dL   Total Protein 6.8 6.0 - 8.5 g/dL   Albumin 3.9 3.8 - 4.9 g/dL   Globulin, Total 2.9 1.5 - 4.5 g/dL   Bilirubin Total 0.4 0.0 - 1.2 mg/dL   Alkaline Phosphatase 105 44 - 121 IU/L   AST 16 0 - 40 IU/L   ALT 10 0 - 32 IU/L  Lipid panel     Status: Abnormal   Collection Time: 10/26/22  8:48 AM  Result Value Ref Range   Cholesterol, Total 178 100 - 199 mg/dL   Triglycerides 67 0 - 149 mg/dL   HDL 47 >14 mg/dL   VLDL Cholesterol Cal 13 5 - 40 mg/dL   LDL Chol Calc (NIH) 782 (H) 0 - 99 mg/dL   Chol/HDL Ratio 3.8 0.0 - 4.4 ratio    Comment:                                   T. Chol/HDL Ratio                                             Men   Women                               1/2 Avg.Risk  3.4    3.3  Avg.Risk  5.0    4.4                                2X Avg.Risk  9.6    7.1                                3X Avg.Risk 23.4   11.0   CBC with Differential/Platelet     Status: None   Collection Time: 10/26/22  8:48 AM  Result Value Ref Range   WBC 6.7 3.4 - 10.8 x10E3/uL   RBC 4.45 3.77 - 5.28 x10E6/uL   Hemoglobin 12.7 11.1 - 15.9 g/dL   Hematocrit 16.1 09.6 - 46.6 %   MCV 89 79 - 97 fL   MCH 28.5 26.6 - 33.0 pg   MCHC 32.0 31.5 - 35.7 g/dL   RDW 04.5 40.9 - 81.1 %   Platelets 362 150 - 450 x10E3/uL   Neutrophils 60 Not Estab. %   Lymphs 32 Not Estab. %   Monocytes 6 Not Estab. %   Eos 1 Not Estab. %   Basos 1 Not Estab. %   Neutrophils Absolute 4.0 1.4 - 7.0 x10E3/uL   Lymphocytes Absolute 2.2 0.7 - 3.1 x10E3/uL   Monocytes Absolute 0.4 0.1 - 0.9 x10E3/uL   EOS (ABSOLUTE) 0.1 0.0 - 0.4 x10E3/uL   Basophils Absolute 0.1 0.0 - 0.2 x10E3/uL   Immature Granulocytes 0 Not Estab. %   Immature Grans (Abs) 0.0 0.0 - 0.1 x10E3/uL       Assessment & Plan:   Problem List Items Addressed This Visit       Active Problems   Hypertension, goal below 140/90 - Primary    Blood pressure well controlled with current medications.  Continue current therapy.  Will reassess at follow up.        Relevant Medications   propranolol (INDERAL) 10 MG tablet   BMI 40.0-44.9, adult (HCC)    Continue current meds.  Will adjust as needed based on results.  The patient is asked to make an attempt to improve diet and exercise patterns to aid in medical management of this problem. Addressed importance of increasing and maintaining water intake.         Return as previously scheduled.   Total time spent: 20 minutes  Miki Kins, FNP  12/17/2022   This document may have been prepared by [facility] Voice Recognition software and as such may include unintentional dictation errors.

## 2022-12-24 ENCOUNTER — Other Ambulatory Visit: Payer: Self-pay

## 2022-12-24 MED ORDER — ZEPBOUND 5 MG/0.5ML ~~LOC~~ SOAJ
5.0000 mg | SUBCUTANEOUS | 0 refills | Status: DC
  Filled 2022-12-24: qty 2, 28d supply, fill #0

## 2023-01-01 IMAGING — MG MM DIGITAL SCREENING BILAT W/ TOMO AND CAD
6 of 12 series · 6 of 36 positions shown · non-contrast
Comparison: Previous exam(s).

CLINICAL DATA: Screening.

EXAM:
DIGITAL SCREENING BILATERAL MAMMOGRAM WITH TOMOSYNTHESIS AND CAD

[L MLO synth-2D (1 of 2)]
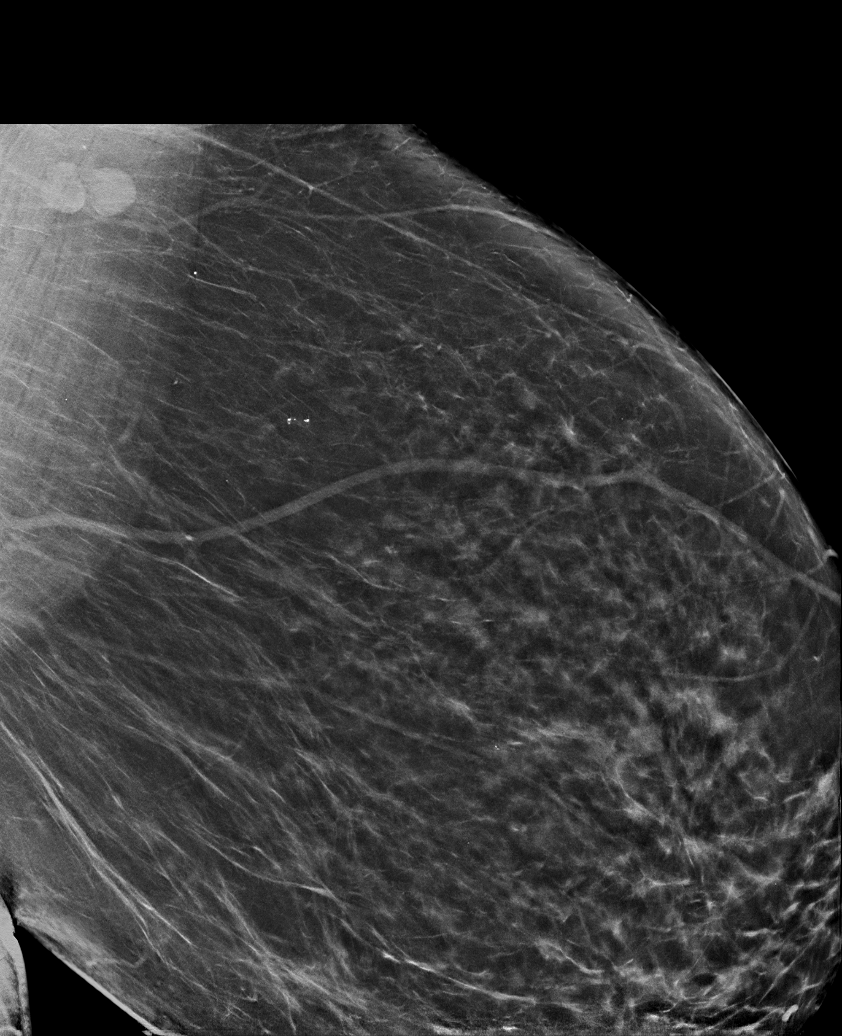

[R CC synth-2D]
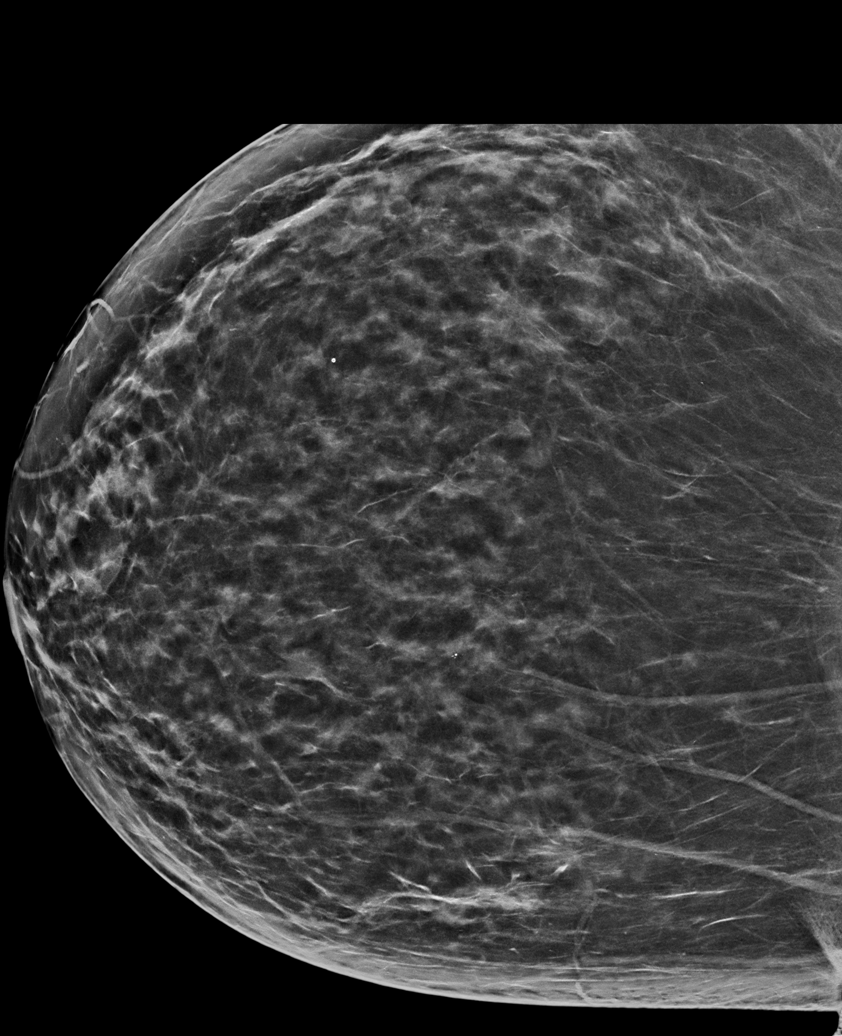

[R MLO synth-2D (1 of 2)]
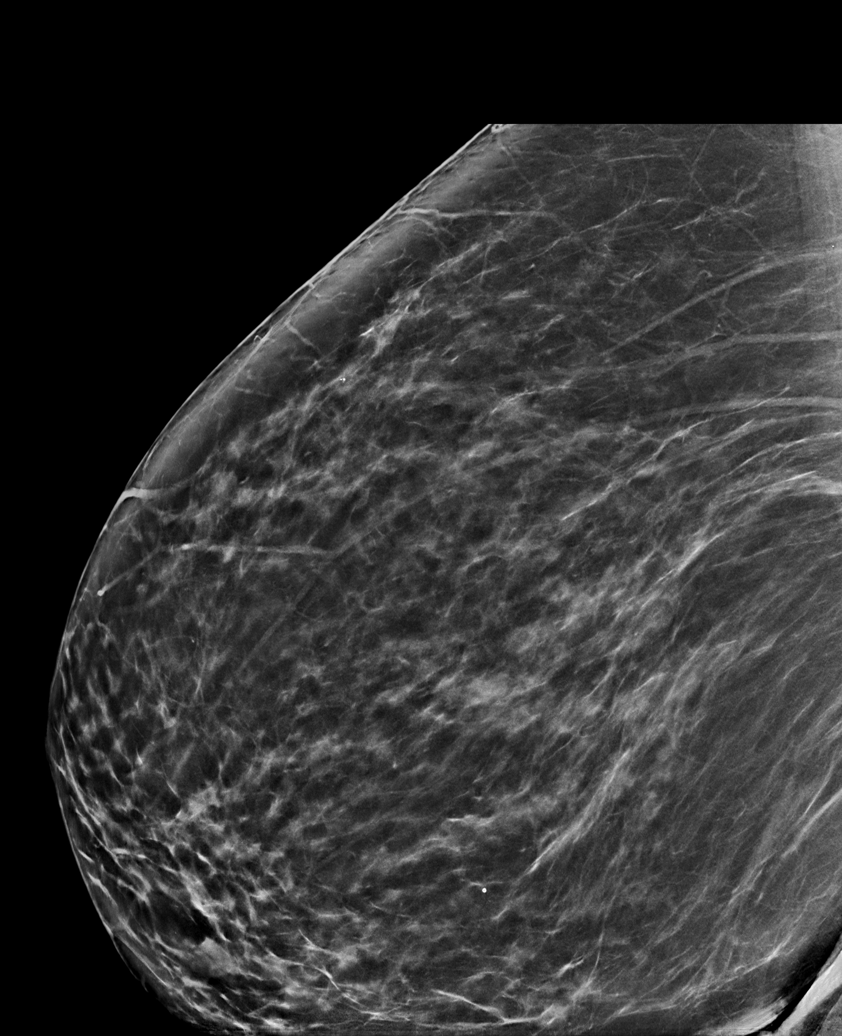

[L MLO synth-2D (2 of 2)]
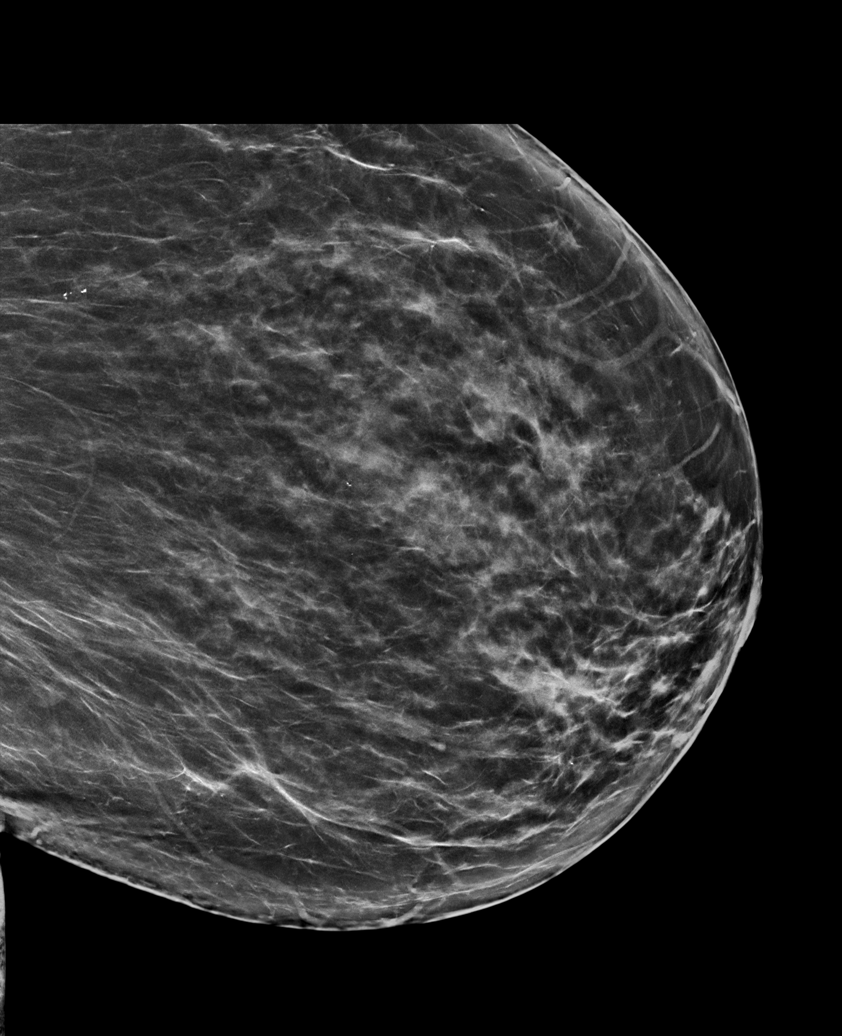

[L CC synth-2D]
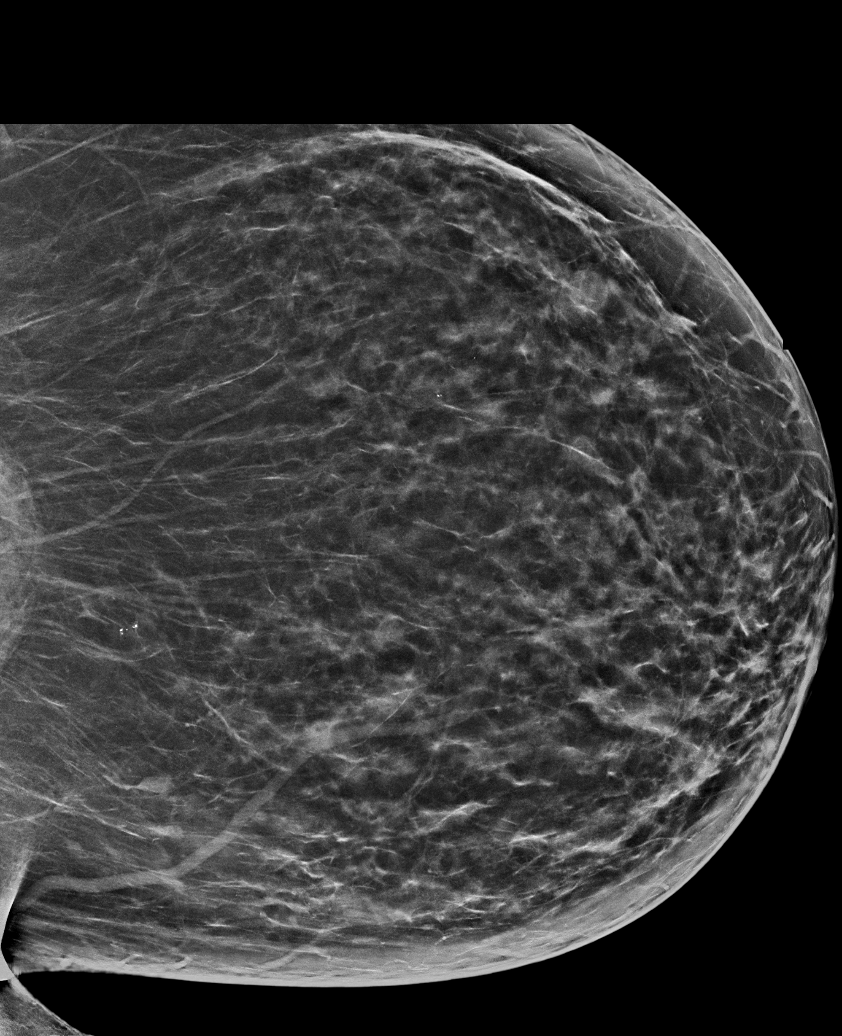

[R MLO synth-2D (2 of 2)]
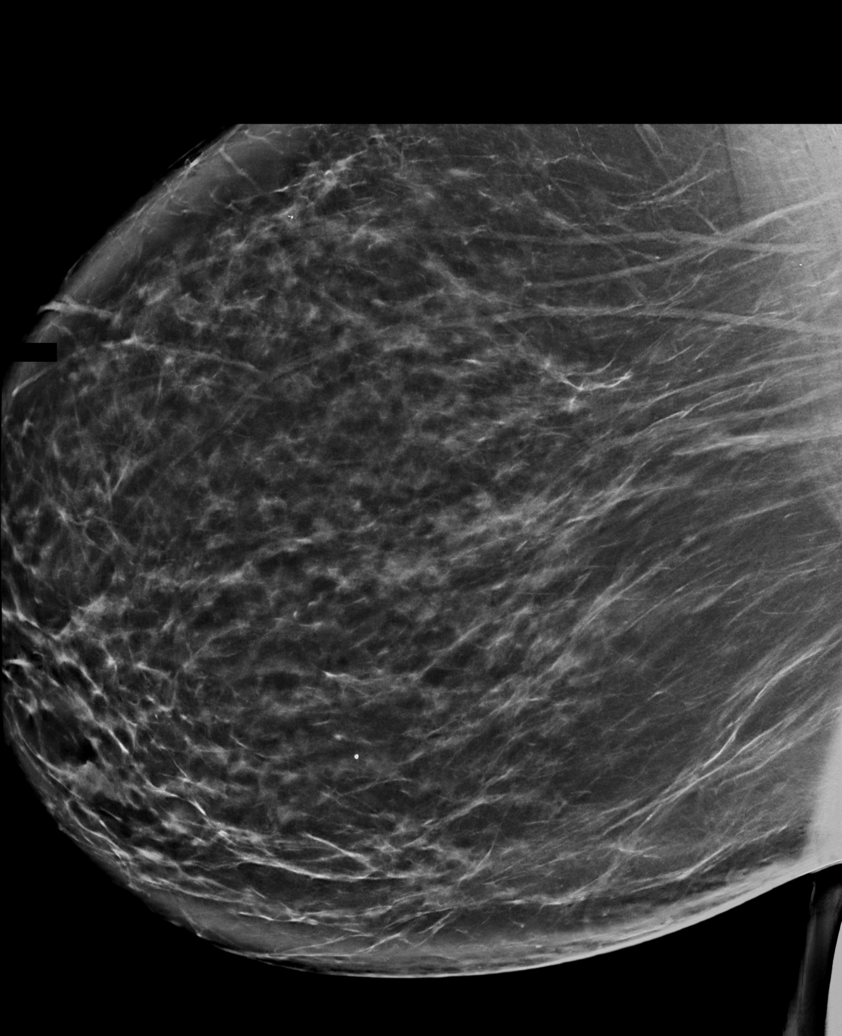

[6 of 36 positions shown; findings below may reference images not displayed]

ACR Breast Density Category b: There are scattered areas of
fibroglandular density.
FINDINGS: There are no findings suspicious for malignancy. The images were
evaluated with computer-aided detection.
IMPRESSION: No mammographic evidence of malignancy. A result letter of this
screening mammogram will be mailed directly to the patient.

RECOMMENDATION:
Screening mammogram in one year. (Code:[id number])

BI-RADS CATEGORY  1: Negative.

## 2023-01-24 ENCOUNTER — Ambulatory Visit (INDEPENDENT_AMBULATORY_CARE_PROVIDER_SITE_OTHER): Payer: Managed Care, Other (non HMO) | Admitting: Family

## 2023-01-24 ENCOUNTER — Encounter: Payer: Self-pay | Admitting: Family

## 2023-01-24 VITALS — BP 126/86 | HR 72 | Ht 66.0 in | Wt 274.4 lb

## 2023-01-24 DIAGNOSIS — E890 Postprocedural hypothyroidism: Secondary | ICD-10-CM

## 2023-01-24 DIAGNOSIS — Z6841 Body Mass Index (BMI) 40.0 and over, adult: Secondary | ICD-10-CM

## 2023-01-24 DIAGNOSIS — I10 Essential (primary) hypertension: Secondary | ICD-10-CM

## 2023-01-24 DIAGNOSIS — E789 Disorder of lipoprotein metabolism, unspecified: Secondary | ICD-10-CM | POA: Diagnosis not present

## 2023-01-24 DIAGNOSIS — F411 Generalized anxiety disorder: Secondary | ICD-10-CM

## 2023-01-24 DIAGNOSIS — E538 Deficiency of other specified B group vitamins: Secondary | ICD-10-CM

## 2023-01-24 DIAGNOSIS — E559 Vitamin D deficiency, unspecified: Secondary | ICD-10-CM

## 2023-01-24 DIAGNOSIS — R7303 Prediabetes: Secondary | ICD-10-CM

## 2023-01-24 DIAGNOSIS — F419 Anxiety disorder, unspecified: Secondary | ICD-10-CM

## 2023-01-24 MED ORDER — ZEPBOUND 7.5 MG/0.5ML ~~LOC~~ SOAJ: 7.5000 mg | SUBCUTANEOUS | 1 refills | Status: DC

## 2023-01-24 NOTE — Assessment & Plan Note (Signed)
Patient stable.  Well controlled with current therapy.   Continue current meds.  

## 2023-01-24 NOTE — Assessment & Plan Note (Signed)
Continue current meds. - increasing Zepbound dose   Will adjust as needed based on results.  The patient is asked to make an attempt to improve diet and exercise patterns to aid in medical management of this problem. Addressed importance of increasing and maintaining water intake.

## 2023-01-24 NOTE — Assessment & Plan Note (Signed)
Blood pressure well controlled with current medications.  Continue current therapy.  Will reassess at follow up.  

## 2023-01-24 NOTE — Progress Notes (Signed)
Established Patient Office Visit  Subjective:  Patient ID: Kristina Peck, female    DOB: 1960/07/27  Age: 62 y.o. MRN: [id number]  Chief Complaint  Patient presents with   Follow-up    4 month follow up    Patient is here today for her 4 months follow up.  She has been feeling fairly well since last appointment.   She does have additional concerns to discuss today.  She has been having significant fatigue and feeling very tired all the time.   Labs are due today. She needs refills.   I have reviewed her active problem list, medication list, allergies, notes from last encounter, lab results for her appointment today.      No other concerns at this time.   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   MRI 06/01/10 stable well circumscribed left perineal mass adjacent to levator muscle 3.3 x 2.8 cm.   07/20/10 Xlap no tumor seen or palpated. Post-op SBO wound infection requiring wound VAC and antibiotics at [facility]   Thyroid disease     Past Surgical History:  Procedure Laterality Date   ABDOMINAL HYSTERECTOMY     APPENDECTOMY     BREAST BIOPSY Left 11/03/2022   lt stereo calcs ribbon clip path pending   BREAST BIOPSY Left 11/03/2022   MM LT BREAST BX W LOC DEV 1ST LESION IMAGE BX SPEC STEREO GUIDE 11/03/2022 [facility]   BREAST REDUCTION SURGERY Bilateral 09/26/2020   Procedure: BILATERAL MAMMARY REDUCTION  (BREAST);  Surgeon: Allena Napoleon, MD;  Location: [facility];  Service: Plastics;  Laterality: Bilateral;  2 hours    COLON SURGERY     HEMATOMA EVACUATION Left 09/26/2020   Procedure: EVACUATION HEMATOMA LEFT BREAST;  Surgeon: Allena Napoleon, MD;  Location: [facility];  Service: Plastics;  Laterality: Left;   KNEE ARTHROSCOPY Right 07/10/2015   KNEE ARTHROSCOPY W/ MENISCAL REPAIR Right 05/13/2014   MEDIAL PARTIAL KNEE REPLACEMENT Right [id number]   REDUCTION MAMMAPLASTY     TUBAL LIGATION Bilateral 06/09/2009   Bilateral salphingo-oophorectomy    Social History   Socioeconomic History   Marital status: Married    Spouse name: Not on file   Number of children: 2   Years of education: Not on file   Highest education level: Not on file  Occupational History   Occupation: Film/video editor   Tobacco Use   Smoking status: Former    Current packs/day: 0.00    Average packs/day: 0.5 packs/day for 20.0 years (10.0 ttl pk-yrs)    Types: Cigarettes    Start date: 03/08/1988    Quit date: 03/08/2008    Years since quitting: 14.8   Smokeless tobacco: Never  Vaping Use   Vaping status: Never Used  Substance and Sexual Activity   Alcohol use: Yes    Alcohol/week: 0.0 standard drinks of alcohol    Comment: moderately   Drug use: No   Sexual activity: Yes  Partners: Male    Birth control/protection: Surgical  Other Topics Concern   Not on file  Social History Narrative   Not on file   Social Determinants of Health   Financial Resource Strain: Not on file  Food Insecurity: Not on file  Transportation Needs: Not on file  Physical Activity: Not on file  Stress: Not on file  Social Connections: Not on file  Intimate Partner Violence: Not on file    Family History  Problem Relation Age of Onset   Diabetes Mother    Hypertension Mother    Hypertension Father    Aneurysm Father        aortic   Hypertension Sister    Heart attack Maternal Grandmother    Heart failure Maternal Grandfather    Heart attack Paternal Grandmother    Cancer Paternal Grandfather        not sure    Breast cancer Neg Hx     Allergies  Allergen Reactions   Lisinopril Hives    Review of Systems  Constitutional:  Positive for malaise/fatigue.  All other systems reviewed and are negative.      Objective:   BP 126/86   Pulse 72   Ht 5\' 6"  (1.676 m)   Wt 274 lb 6.4 oz (124.5 kg)   SpO2 98%   BMI 44.29 kg/m   Vitals:   01/24/23 0925  BP: 126/86  Pulse: 72  Height: 5\' 6"  (1.676 m)  Weight: 274 lb 6.4 oz (124.5 kg)  SpO2: 98%  BMI (Calculated): 44.31    Physical Exam Vitals and nursing note reviewed.  Constitutional:      Appearance: Normal appearance. She is obese.  HENT:     Head: Normocephalic.  Eyes:     Extraocular Movements: Extraocular movements intact.     Conjunctiva/sclera: Conjunctivae normal.     Pupils: Pupils are equal, round, and reactive to light.  Cardiovascular:     Rate and Rhythm: Normal rate.  Pulmonary:     Effort: Pulmonary effort is normal.  Neurological:     General: No focal deficit present.     Mental Status: She is alert and oriented to person, place, and time. Mental status is at baseline.  Psychiatric:        Mood and Affect: Mood normal.        Behavior: Behavior normal.        Thought Content: Thought content normal.        Judgment: Judgment normal.      No results found for any visits on 01/24/23.  No results found for this or any previous visit (from the past 2160 hour(s)).     Assessment & Plan:   Problem List Items Addressed This Visit       Active Problems   Hypertension, goal below 140/90    Blood pressure well controlled with current medications.  Continue current therapy.  Will reassess at follow up.       Generalized anxiety disorder - Primary   Relevant Orders   CMP14+EGFR   TSH   CBC with Diff   BMI 40.0-44.9, adult (HCC)    Continue current meds. - increasing Zepbound dose   Will adjust as needed based on results.  The patient is asked to make an attempt to improve diet and exercise patterns  to aid in medical management of this problem. Addressed importance of increasing and maintaining water intake.        Relevant Medications   tirzepatide (  ZEPBOUND) 7.5 MG/0.5ML Pen   Other Relevant Orders   CMP14+EGFR   TSH   CBC with Diff   Anxiety    Patient stable.  Well controlled with current therapy.   Continue current meds.       Borderline high cholesterol    Checking labs today.  Continue current therapy for lipid control. Will modify as needed based on labwork results.        Relevant Orders   Lipid panel   CMP14+EGFR   TSH   CBC with Diff   Postprocedural hypothyroidism    Patient stable.  Well controlled with current therapy.   Continue current meds.       Relevant Orders   CMP14+EGFR   TSH   CBC with Diff   Other Visit Diagnoses     B12 deficiency due to diet       Checking labs today.  Will continue supplements as needed.   Relevant Orders   CMP14+EGFR   TSH   Vitamin B12   CBC with Diff   Vitamin D deficiency, unspecified       Checking labs today.  Will continue supplements as needed.   Relevant Orders   VITAMIN D 25 Hydroxy (Vit-D Deficiency, Fractures)   CMP14+EGFR   TSH   CBC with Diff   Prediabetes       A1C is in prediabetic ranges. Patient counseled on dietary choices and verbalized understanding. Will reassess at follow up after next lab check.   Relevant Orders   CMP14+EGFR   TSH   Hemoglobin A1c   CBC with Diff       Return in about 4 months (around 05/24/2023).   Total time spent: 30 minutes  Miki Kins, FNP  01/24/2023   This document may have been prepared by [facility] Voice Recognition software and as such may include unintentional dictation errors.

## 2023-01-24 NOTE — Assessment & Plan Note (Signed)
Checking labs today.  Continue current therapy for lipid control. Will modify as needed based on labwork results.  

## 2023-01-25 ENCOUNTER — Other Ambulatory Visit: Payer: Self-pay

## 2023-01-25 MED ORDER — ZEPBOUND 7.5 MG/0.5ML ~~LOC~~ SOAJ
7.5000 mg | SUBCUTANEOUS | 0 refills | Status: DC
  Filled 2023-01-25: qty 2, 28d supply, fill #0

## 2023-02-23 ENCOUNTER — Encounter: Payer: Self-pay | Admitting: Cardiology

## 2023-02-23 ENCOUNTER — Ambulatory Visit (INDEPENDENT_AMBULATORY_CARE_PROVIDER_SITE_OTHER): Payer: Managed Care, Other (non HMO) | Admitting: Cardiology

## 2023-02-23 ENCOUNTER — Other Ambulatory Visit: Payer: Self-pay

## 2023-02-23 VITALS — BP 138/72 | HR 90 | Ht 66.0 in | Wt 267.4 lb

## 2023-02-23 DIAGNOSIS — J069 Acute upper respiratory infection, unspecified: Secondary | ICD-10-CM | POA: Insufficient documentation

## 2023-02-23 DIAGNOSIS — Z0130 Encounter for examination of blood pressure without abnormal findings: Secondary | ICD-10-CM

## 2023-02-23 MED ORDER — ZEPBOUND 10 MG/0.5ML ~~LOC~~ SOAJ
10.0000 mg | SUBCUTANEOUS | 0 refills | Status: DC
  Filled 2023-02-23: qty 2, 28d supply, fill #0

## 2023-02-23 MED ORDER — AMOXICILLIN-POT CLAVULANATE 875-125 MG PO TABS: 1.0000 | ORAL_TABLET | Freq: Two times a day (BID) | ORAL | 0 refills | Status: AC

## 2023-02-23 NOTE — Progress Notes (Signed)
Established Patient Office Visit  Subjective:  Patient ID: Kristina Peck, female    DOB: 12-Jan-1961  Age: 62 y.o. MRN: [id number]  Chief Complaint  Patient presents with   Sore Throat    Has been taking OTC  Symptoms started Monday      Patient in office for an acute visit. Patient complaining of a sore throat. Patient states she was around her daughter and grand son over the weekend, they were sick. On Monday, patient started to experience PND and sore throat, sinus pressure, congestion, cough and headache. Will send in Augmentin. Recommend Mucinex, Zyrtec and increase fluids.   Sore Throat  This is a new problem. The current episode started in the past 7 days. The problem has been gradually worsening. Neither side of throat is experiencing more pain than the other. There has been no fever. The pain is mild. Associated symptoms include congestion, coughing, headaches, a hoarse voice and stridor. Pertinent negatives include no abdominal pain, diarrhea, ear pain or shortness of breath. Treatments tried: antihistamine. The treatment provided no relief.    No other concerns at this time.   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   MRI 06/01/10 stable well circumscribed left perineal mass adjacent to levator muscle 3.3 x 2.8 cm.   07/20/10 Xlap no tumor seen or palpated. Post-op SBO wound infection requiring wound VAC and antibiotics at [facility]   Thyroid disease     Past Surgical History:  Procedure Laterality Date   ABDOMINAL HYSTERECTOMY      APPENDECTOMY     BREAST BIOPSY Left 11/03/2022   lt stereo calcs ribbon clip path pending   BREAST BIOPSY Left 11/03/2022   MM LT BREAST BX W LOC DEV 1ST LESION IMAGE BX SPEC STEREO GUIDE 11/03/2022 [facility]   BREAST REDUCTION SURGERY Bilateral 09/26/2020   Procedure: BILATERAL MAMMARY REDUCTION  (BREAST);  Surgeon: Allena Napoleon, MD;  Location: [facility];  Service: Plastics;  Laterality: Bilateral;  2 hours   COLON SURGERY     HEMATOMA EVACUATION Left 09/26/2020   Procedure: EVACUATION HEMATOMA LEFT BREAST;  Surgeon: Allena Napoleon, MD;  Location: [facility];  Service: Plastics;  Laterality: Left;   KNEE ARTHROSCOPY Right 07/10/2015   KNEE ARTHROSCOPY W/ MENISCAL REPAIR Right 05/13/2014   MEDIAL PARTIAL KNEE REPLACEMENT Right [id number]   REDUCTION MAMMAPLASTY     TUBAL LIGATION Bilateral 06/09/2009   Bilateral salphingo-oophorectomy    Social History   Socioeconomic History   Marital status: Married    Spouse name: Not on file   Number of children: 2   Years of education: Not on file   Highest education level: Not on file  Occupational History   Occupation: Film/video editor   Tobacco Use   Smoking status: Former    Current packs/day: 0.00    Average packs/day: 0.5 packs/day for 20.0 years (10.0 ttl pk-yrs)    Types: Cigarettes    Start date: 03/08/1988  Quit date: 03/08/2008    Years since quitting: 14.9   Smokeless tobacco: Never  Vaping Use   Vaping status: Never Used  Substance and Sexual Activity   Alcohol use: Yes    Alcohol/week: 0.0 standard drinks of alcohol    Comment: moderately   Drug use: No   Sexual activity: Yes    Partners: Male    Birth control/protection: Surgical  Other Topics Concern   Not on file  Social History Narrative   Not on file   Social Drivers of Health   Financial Resource Strain: Not on file  Food Insecurity: Not on file  Transportation Needs: Not on file  Physical Activity: Not on file   Stress: Not on file  Social Connections: Not on file  Intimate Partner Violence: Not on file    Family History  Problem Relation Age of Onset   Diabetes Mother    Hypertension Mother    Hypertension Father    Aneurysm Father        aortic   Hypertension Sister    Heart attack Maternal Grandmother    Heart failure Maternal Grandfather    Heart attack Paternal Grandmother    Cancer Paternal Grandfather        not sure   Breast cancer Neg Hx     Allergies  Allergen Reactions   Lisinopril Hives    Outpatient Medications Prior to Visit  Medication Sig   rosuvastatin (CRESTOR) 5 MG tablet Take 1 tablet (5 mg total) by mouth daily. (Patient not taking: Reported on 01/24/2023)   amLODipine (NORVASC) 5 MG tablet Take 1 tablet (5 mg total) by mouth daily.   ARMOUR THYROID 180 MG tablet TAKE 1 TABLET BY MOUTH DAILY ON AN EMPTY STOMACH.   busPIRone (BUSPAR) 15 MG tablet Take 1 tablet (15 mg total) by mouth 2 (two) times daily. (Patient taking differently: Take 15 mg by mouth daily.)   desvenlafaxine (PRISTIQ) 100 MG 24 hr tablet TAKE 1 TABLET BY MOUTH EVERY DAY   hydrochlorothiazide (HYDRODIURIL) 25 MG tablet Take 1 tablet (25 mg total) by mouth daily.   LORazepam (ATIVAN) 0.5 MG tablet Take 0.5 mg by mouth daily as needed.   losartan (COZAAR) 100 MG tablet Take 1 tablet (100 mg total) by mouth daily.   ondansetron (ZOFRAN) 4 MG tablet Take 1 tablet (4 mg total) by mouth every 8 (eight) hours as needed for nausea or vomiting.   propranolol (INDERAL) 10 MG tablet Take 1 tablet (10 mg total) by mouth 2 (two) times daily as needed (anxiety).   tirzepatide (ZEPBOUND) 7.5 MG/0.5ML Pen Inject 7.5 mg into the skin once a week.   traZODone (DESYREL) 50 MG tablet Take 0.5-1 tablets (25-50 mg total) by mouth at bedtime as needed. for sleep   Ubrogepant (UBRELVY) 100 MG TABS Take 1 tablet (100 mg total) by mouth daily as needed (for migraine. May repeat in 2 hours if not improved.).   Vitamin D,  Ergocalciferol, (DRISDOL) 1.25 MG (50000 UNIT) CAPS capsule Take 50,000 Units by mouth once a week. (Patient not taking: Reported on 01/24/2023)   ZEPBOUND 7.5 MG/0.5ML Pen Inject 7.5 mg into the skin once a week.   No facility-administered medications prior to visit.    Review of Systems  Constitutional: Negative.   HENT:  Positive for congestion, hoarse voice, sinus pain and sore throat. Negative for ear pain.   Eyes: Negative.   Respiratory:  Positive for cough, wheezing and stridor. Negative for shortness of breath.  Cardiovascular: Negative.  Negative for chest pain.  Gastrointestinal: Negative.  Negative for abdominal pain, constipation and diarrhea.  Genitourinary: Negative.   Musculoskeletal:  Negative for joint pain and myalgias.  Skin: Negative.   Neurological:  Positive for headaches. Negative for dizziness.  Endo/Heme/Allergies: Negative.   All other systems reviewed and are negative.      Objective:   BP 138/72   Pulse 90   Ht 5\' 6"  (1.676 m)   Wt 267 lb 6.4 oz (121.3 kg)   SpO2 97%   BMI 43.16 kg/m   Vitals:   02/23/23 0958  BP: 138/72  Pulse: 90  Height: 5\' 6"  (1.676 m)  Weight: 267 lb 6.4 oz (121.3 kg)  SpO2: 97%  BMI (Calculated): 43.18    Physical Exam Vitals and nursing note reviewed.  Constitutional:      Appearance: Normal appearance. She is normal weight.  HENT:     Head: Normocephalic and atraumatic.     Nose: Nose normal.     Mouth/Throat:     Mouth: Mucous membranes are moist.  Eyes:     Extraocular Movements: Extraocular movements intact.     Conjunctiva/sclera: Conjunctivae normal.     Pupils: Pupils are equal, round, and reactive to light.  Cardiovascular:     Rate and Rhythm: Normal rate and regular rhythm.     Pulses: Normal pulses.     Heart sounds: Normal heart sounds.  Pulmonary:     Effort: Pulmonary effort is normal.     Breath sounds: Stridor present. Wheezing present.  Abdominal:     General: Abdomen is flat. Bowel  sounds are normal.     Palpations: Abdomen is soft.  Musculoskeletal:        General: Normal range of motion.     Cervical back: Normal range of motion.  Skin:    General: Skin is warm and dry.  Neurological:     General: No focal deficit present.     Mental Status: She is alert and oriented to person, place, and time.  Psychiatric:        Mood and Affect: Mood normal.        Behavior: Behavior normal.        Thought Content: Thought content normal.        Judgment: Judgment normal.      No results found for any visits on 02/23/23.  No results found for this or any previous visit (from the past 2160 hours).    Assessment & Plan:  Augmentin Mucinex Zyrtec Increase fluids Rest  Problem List Items Addressed This Visit       Respiratory   Acute URI - Primary    Return if symptoms worsen or fail to improve.   Total time spent: 25 minutes  [employer], NP  02/23/2023   This document may have been prepared by Dragon Voice Recognition software and as such may include unintentional dictation errors.

## 2023-03-22 ENCOUNTER — Other Ambulatory Visit: Payer: Self-pay

## 2023-03-22 MED ORDER — ZEPBOUND 12.5 MG/0.5ML ~~LOC~~ SOAJ
12.5000 mg | SUBCUTANEOUS | 0 refills | Status: DC
Start: 2023-03-22 — End: 2023-07-06
  Filled 2023-03-22: qty 2, 28d supply, fill #0

## 2023-04-15 ENCOUNTER — Other Ambulatory Visit: Payer: Self-pay | Admitting: Family

## 2023-04-16 MED ORDER — LORAZEPAM 0.5 MG PO TABS: 0.5000 mg | ORAL_TABLET | Freq: Every day | ORAL | 2 refills | Status: AC | PRN

## 2023-04-21 ENCOUNTER — Other Ambulatory Visit: Payer: Self-pay

## 2023-04-21 MED ORDER — ZEPBOUND 15 MG/0.5ML ~~LOC~~ SOAJ
15.0000 mg | SUBCUTANEOUS | 0 refills | Status: DC
  Filled 2023-04-21: qty 2, 28d supply, fill #0

## 2023-05-03 ENCOUNTER — Other Ambulatory Visit: Payer: Self-pay | Admitting: Family

## 2023-05-18 ENCOUNTER — Other Ambulatory Visit: Payer: Self-pay

## 2023-05-18 MED ORDER — ZEPBOUND 15 MG/0.5ML ~~LOC~~ SOAJ
15.0000 mg | SUBCUTANEOUS | 0 refills | Status: DC
  Filled 2023-05-18: qty 2, 28d supply, fill #0

## 2023-05-21 ENCOUNTER — Other Ambulatory Visit: Payer: Self-pay | Admitting: Family

## 2023-05-24 ENCOUNTER — Ambulatory Visit: Payer: Managed Care, Other (non HMO) | Admitting: Family

## 2023-05-28 ENCOUNTER — Other Ambulatory Visit: Payer: Self-pay | Admitting: Family

## 2023-05-28 DIAGNOSIS — I10 Essential (primary) hypertension: Secondary | ICD-10-CM

## 2023-05-30 ENCOUNTER — Encounter: Payer: Self-pay | Admitting: Family

## 2023-05-30 ENCOUNTER — Ambulatory Visit: Admitting: Family

## 2023-05-30 VITALS — BP 128/88 | HR 83 | Ht 66.0 in | Wt 252.8 lb

## 2023-05-30 DIAGNOSIS — R300 Dysuria: Secondary | ICD-10-CM

## 2023-05-30 DIAGNOSIS — Z780 Asymptomatic menopausal state: Secondary | ICD-10-CM

## 2023-05-30 DIAGNOSIS — E782 Mixed hyperlipidemia: Secondary | ICD-10-CM

## 2023-05-30 DIAGNOSIS — R5383 Other fatigue: Secondary | ICD-10-CM

## 2023-05-30 DIAGNOSIS — E538 Deficiency of other specified B group vitamins: Secondary | ICD-10-CM

## 2023-05-30 DIAGNOSIS — I10 Essential (primary) hypertension: Secondary | ICD-10-CM | POA: Diagnosis not present

## 2023-05-30 DIAGNOSIS — R7303 Prediabetes: Secondary | ICD-10-CM

## 2023-05-30 DIAGNOSIS — E559 Vitamin D deficiency, unspecified: Secondary | ICD-10-CM

## 2023-05-30 DIAGNOSIS — E890 Postprocedural hypothyroidism: Secondary | ICD-10-CM

## 2023-05-30 LAB — POCT URINALYSIS DIPSTICK
Bilirubin, UA: NEGATIVE
Glucose, UA: NEGATIVE
Ketones, UA: NEGATIVE
Leukocytes, UA: NEGATIVE
Nitrite, UA: NEGATIVE
Protein, UA: NEGATIVE
Spec Grav, UA: 1.015 (ref 1.010–1.025)
Urobilinogen, UA: 0.2 U/dL
pH, UA: 6 (ref 5.0–8.0)

## 2023-05-30 MED ORDER — CLONIDINE HCL 0.1 MG PO TABS: 0.1000 mg | ORAL_TABLET | Freq: Two times a day (BID) | ORAL | 11 refills | Status: DC

## 2023-05-30 NOTE — Progress Notes (Signed)
 Established Patient Office Visit  Subjective:  Patient ID: Kristina Peck, female    DOB: March 23, 1960  Age: 63 y.o. MRN: [id number]  Chief Complaint  Patient presents with   Hypertension    Patient is here today for her 4 months follow up.  She has been feeling fairly well since last appointment.   She does have additional concerns to discuss today.  She has been having significant fatigue and feeling very tired all the time.   Labs are due today. She needs refills.   I have reviewed her active problem list, medication list, allergies, notes from last encounter, lab results for her appointment today.      No other concerns at this time.   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   MRI 06/01/10 stable well circumscribed left perineal mass adjacent to levator muscle 3.3 x 2.8 cm.   07/20/10 Xlap no tumor seen or palpated. Post-op SBO wound infection requiring wound VAC and antibiotics at [facility]   Thyroid  disease     Past Surgical History:  Procedure Laterality Date   ABDOMINAL HYSTERECTOMY     APPENDECTOMY     BREAST BIOPSY Left 11/03/2022   lt stereo calcs ribbon clip path pending   BREAST BIOPSY Left 11/03/2022   MM LT BREAST BX W LOC DEV 1ST LESION IMAGE BX SPEC STEREO GUIDE 11/03/2022 [facility]   BREAST REDUCTION SURGERY Bilateral 09/26/2020   Procedure: BILATERAL MAMMARY REDUCTION  (BREAST);  Surgeon: Elisabeth Craig RAMAN, MD;  Location: [facility];  Service: Plastics;  Laterality: Bilateral;  2 hours   COLON SURGERY      HEMATOMA EVACUATION Left 09/26/2020   Procedure: EVACUATION HEMATOMA LEFT BREAST;  Surgeon: Elisabeth Craig RAMAN, MD;  Location: [facility];  Service: Plastics;  Laterality: Left;   KNEE ARTHROSCOPY Right 07/10/2015   KNEE ARTHROSCOPY W/ MENISCAL REPAIR Right 05/13/2014   MEDIAL PARTIAL KNEE REPLACEMENT Right [id number]   REDUCTION MAMMAPLASTY     TUBAL LIGATION Bilateral 06/09/2009   Bilateral salphingo-oophorectomy    Social History   Socioeconomic History   Marital status: Married    Spouse name: Not on file   Number of children: 2   Years of education: Not on file   Highest education level: Not on file  Occupational History   Occupation: Film/video editor   Tobacco Use   Smoking status: Former    Current packs/day: 0.00    Average packs/day: 0.5 packs/day for 20.0 years (10.0 ttl pk-yrs)    Types: Cigarettes    Start date: 03/08/1988    Quit date: 03/08/2008    Years since quitting: 15.7   Smokeless tobacco: Never  Vaping Use   Vaping status: Never Used  Substance and Sexual Activity   Alcohol use: Yes    Alcohol/week: 0.0 standard drinks of alcohol    Comment: moderately   Drug use: No   Sexual activity: Yes    Partners: Male  Birth control/protection: Surgical  Other Topics Concern   Not on file  Social History Narrative   Not on file   Social Drivers of Health   Financial Resource Strain: Not on file  Food Insecurity: Not on file  Transportation Needs: Not on file  Physical Activity: Not on file  Stress: Not on file  Social Connections: Not on file  Intimate Partner Violence: Not on file    Family History  Problem Relation Age of Onset   Diabetes Mother    Hypertension Mother    Hypertension Father    Aneurysm Father        aortic   Hypertension Sister    Heart attack Maternal Grandmother    Heart failure Maternal Grandfather    Heart attack Paternal Grandmother    Cancer Paternal Grandfather        not sure   Breast cancer Neg Hx      Allergies  Allergen Reactions   Lisinopril Hives    Review of Systems  Constitutional:  Positive for malaise/fatigue.  All other systems reviewed and are negative.      Objective:   BP 128/88   Pulse 83   Ht 5' 6 (1.676 m)   Wt 252 lb 12.8 oz (114.7 kg)   SpO2 99%   BMI 40.80 kg/m   Vitals:   05/30/23 1527  BP: 128/88  Pulse: 83  Height: 5' 6 (1.676 m)  Weight: 252 lb 12.8 oz (114.7 kg)  SpO2: 99%  BMI (Calculated): 40.82    Physical Exam Vitals and nursing note reviewed.  Constitutional:      Appearance: Normal appearance. She is obese.  HENT:     Head: Normocephalic.  Eyes:     Extraocular Movements: Extraocular movements intact.     Conjunctiva/sclera: Conjunctivae normal.     Pupils: Pupils are equal, round, and reactive to light.  Cardiovascular:     Rate and Rhythm: Normal rate.  Pulmonary:     Effort: Pulmonary effort is normal.  Neurological:     General: No focal deficit present.     Mental Status: She is alert and oriented to person, place, and time. Mental status is at baseline.  Psychiatric:        Mood and Affect: Mood normal.        Behavior: Behavior normal.        Thought Content: Thought content normal.        Judgment: Judgment normal.      Results for orders placed or performed in visit on 05/30/23  Lipid panel  Result Value Ref Range   Cholesterol, Total 197 100 - 199 mg/dL   Triglycerides 75 0 - 149 mg/dL   HDL 45 >60 mg/dL   VLDL Cholesterol Cal 14 5 - 40 mg/dL   LDL Chol Calc (NIH) 861 (H) 0 - 99 mg/dL   Chol/HDL Ratio 4.4 0.0 - 4.4 ratio  VITAMIN D  25 Hydroxy (Vit-D Deficiency, Fractures)  Result Value Ref Range   Vit D, 25-Hydroxy 25.4 (L) 30.0 - 100.0 ng/mL  CMP14+EGFR  Result Value Ref Range   Glucose 86 70 - 99 mg/dL   BUN 9 8 - 27 mg/dL   Creatinine, Ser 9.13 0.57 - 1.00 mg/dL   eGFR 77 >40 fO/fpw/8.26   BUN/Creatinine Ratio 10 (L) 12 - 28   Sodium 138 134 - 144 mmol/L   Potassium 3.2 (L) 3.5 - 5.2  mmol/L   Chloride 96 96 - 106 mmol/L   CO2  24 20 - 29 mmol/L   Calcium  9.0 8.7 - 10.3 mg/dL   Total Protein 7.1 6.0 - 8.5 g/dL   Albumin 4.2 3.8 - 4.9 g/dL   Globulin, Total 2.9 1.5 - 4.5 g/dL   Bilirubin Total 0.4 0.0 - 1.2 mg/dL   Alkaline Phosphatase 111 44 - 121 IU/L   AST 16 0 - 40 IU/L   ALT 10 0 - 32 IU/L  TSH  Result Value Ref Range   TSH 9.290 (H) 0.450 - 4.500 uIU/mL  Hemoglobin A1c  Result Value Ref Range   Hgb A1c MFr Bld 5.2 4.8 - 5.6 %   Est. average glucose Bld gHb Est-mCnc 103 mg/dL  Vitamin B12  Result Value Ref Range   Vitamin B-12 508 232 - 1,245 pg/mL  Iron, TIBC and Ferritin Panel  Result Value Ref Range   Total Iron Binding Capacity 308 250 - 450 ug/dL   UIBC 758 868 - 574 ug/dL   Iron 67 27 - 840 ug/dL   Iron Saturation 22 15 - 55 %   Ferritin 215 (H) 15 - 150 ng/mL  Anti mullerian hormone  Result Value Ref Range   ANTI-MULLERIAN HORMONE ([facility]) <0.015 ng/mL  FSH+Prog+E2+SHBG  Result Value Ref Range   FSH 56.6 25.8 - 134.8 mIU/mL   Progesterone 0.3 ng/mL   Sex Hormone Binding 72.7 17.3 - 125.0 nmol/L   Estradiol 31.6 0.0 - 54.7 pg/mL  POCT Urinalysis Dipstick (81002)  Result Value Ref Range   Color, UA Yellow    Clarity, UA Clear    Glucose, UA Negative Negative   Bilirubin, UA Negative    Ketones, UA Negative    Spec Grav, UA 1.015 1.010 - 1.025   Blood, UA Trace    pH, UA 6.0 5.0 - 8.0   Protein, UA Negative Negative   Urobilinogen, UA 0.2 0.2 or 1.0 E.U./dL   Nitrite, UA Negative    Leukocytes, UA Negative Negative   Appearance Clear    Odor No     No results found for this or any previous visit (from the past 2160 hours).     Assessment & Plan:   Problem List Items Addressed This Visit     Postprocedural hypothyroidism   Relevant Orders   CMP14+EGFR (Completed)   TSH (Completed)   CBC with Differential/Platelet   Other Visit Diagnoses       Prediabetes    -  Primary   Relevant Orders   CMP14+EGFR (Completed)    Hemoglobin A1c (Completed)   CBC with Differential/Platelet     Vitamin D  deficiency, unspecified       Relevant Orders   VITAMIN D  25 Hydroxy (Vit-D Deficiency, Fractures) (Completed)   CMP14+EGFR (Completed)   CBC with Differential/Platelet     B12 deficiency due to diet       Relevant Orders   CMP14+EGFR (Completed)   Vitamin B12 (Completed)   CBC with Differential/Platelet     Essential hypertension       Relevant Orders   CMP14+EGFR (Completed)   CBC with Differential/Platelet     Mixed hyperlipidemia       Relevant Orders   Lipid panel (Completed)   CMP14+EGFR (Completed)   CBC with Differential/Platelet     Other fatigue       Relevant Orders   CMP14+EGFR (Completed)   TSH (Completed)   CBC with Differential/Platelet   Iron, TIBC and Ferritin Panel (Completed)     Menopause  Relevant Orders   Anti mullerian hormone (Completed)   FSH+Prog+E2+SHBG (Completed)     Dysuria       Relevant Orders   POCT Urinalysis Dipstick (18997) (Completed)       No follow-ups on file.   Total time spent: 30 minutes  ALAN CHRISTELLA ARRANT, FNP  05/30/2023   This document may have been prepared by [facility] Voice Recognition software and as such may include unintentional dictation errors.

## 2023-06-17 ENCOUNTER — Other Ambulatory Visit

## 2023-06-18 LAB — CMP14+EGFR
ALT: 10 IU/L (ref 0–32)
AST: 16 IU/L (ref 0–40)
Albumin: 4.2 g/dL (ref 3.8–4.9)
Alkaline Phosphatase: 111 IU/L (ref 44–121)
BUN/Creatinine Ratio: 10 — ABNORMAL LOW (ref 12–28)
BUN: 9 mg/dL (ref 8–27)
Bilirubin Total: 0.4 mg/dL (ref 0.0–1.2)
CO2: 24 mmol/L (ref 20–29)
Calcium: 9 mg/dL (ref 8.7–10.3)
Chloride: 96 mmol/L (ref 96–106)
Creatinine, Ser: 0.86 mg/dL (ref 0.57–1.00)
Globulin, Total: 2.9 g/dL (ref 1.5–4.5)
Glucose: 86 mg/dL (ref 70–99)
Potassium: 3.2 mmol/L — ABNORMAL LOW (ref 3.5–5.2)
Sodium: 138 mmol/L (ref 134–144)
Total Protein: 7.1 g/dL (ref 6.0–8.5)
eGFR: 77 mL/min/{1.73_m2} (ref >59)

## 2023-06-18 LAB — TSH: TSH: 9.29 u[IU]/mL — ABNORMAL HIGH (ref 0.450–4.500)

## 2023-06-18 LAB — VITAMIN D 25 HYDROXY (VIT D DEFICIENCY, FRACTURES): Vit D, 25-Hydroxy: 25.4 ng/mL — ABNORMAL LOW (ref 30.0–100.0)

## 2023-06-18 LAB — IRON,TIBC AND FERRITIN PANEL
Ferritin: 215 ng/mL — ABNORMAL HIGH (ref 15–150)
Iron Saturation: 22 % (ref 15–55)
Iron: 67 ug/dL (ref 27–159)
Total Iron Binding Capacity: 308 ug/dL (ref 250–450)
UIBC: 241 ug/dL (ref 131–425)

## 2023-06-18 LAB — LIPID PANEL
Chol/HDL Ratio: 4.4 ratio (ref 0.0–4.4)
Cholesterol, Total: 197 mg/dL (ref 100–199)
HDL: 45 mg/dL (ref >39)
LDL Chol Calc (NIH): 138 mg/dL — ABNORMAL HIGH (ref 0–99)
Triglycerides: 75 mg/dL (ref 0–149)
VLDL Cholesterol Cal: 14 mg/dL (ref 5–40)

## 2023-06-18 LAB — VITAMIN B12: Vitamin B-12: 508 pg/mL (ref 232–1245)

## 2023-06-18 LAB — HEMOGLOBIN A1C
Est. average glucose Bld gHb Est-mCnc: 103 mg/dL
Hgb A1c MFr Bld: 5.2 % (ref 4.8–5.6)

## 2023-06-20 ENCOUNTER — Other Ambulatory Visit: Payer: Self-pay | Admitting: Family

## 2023-06-20 MED ORDER — ARMOUR THYROID 180 MG PO TABS: 180.0000 mg | ORAL_TABLET | Freq: Every day | ORAL | 1 refills | Status: AC

## 2023-06-23 ENCOUNTER — Other Ambulatory Visit: Payer: Self-pay

## 2023-06-23 MED ORDER — ZEPBOUND 15 MG/0.5ML ~~LOC~~ SOAJ
15.0000 mg | SUBCUTANEOUS | 0 refills | Status: DC
  Filled 2023-06-23: qty 2, 28d supply, fill #0

## 2023-06-25 LAB — ANTI MULLERIAN HORMONE: ANTI-MULLERIAN HORMONE (AMH): <0.015 ng/mL

## 2023-06-25 LAB — FSH+PROG+E2+SHBG
Estradiol: 31.6 pg/mL (ref 0.0–54.7)
FSH: 56.6 m[IU]/mL (ref 25.8–134.8)
Progesterone: 0.3 ng/mL
Sex Hormone Binding: 72.7 nmol/L (ref 17.3–125.0)

## 2023-06-30 ENCOUNTER — Ambulatory Visit: Admitting: Family

## 2023-07-04 ENCOUNTER — Ambulatory Visit: Admitting: Family

## 2023-07-06 ENCOUNTER — Ambulatory Visit: Admitting: Family

## 2023-07-06 ENCOUNTER — Encounter: Payer: Self-pay | Admitting: Family

## 2023-07-06 VITALS — BP 150/98 | HR 86 | Ht 66.0 in | Wt 248.2 lb

## 2023-07-06 DIAGNOSIS — R946 Abnormal results of thyroid function studies: Secondary | ICD-10-CM | POA: Diagnosis not present

## 2023-07-06 DIAGNOSIS — E890 Postprocedural hypothyroidism: Secondary | ICD-10-CM

## 2023-07-06 DIAGNOSIS — I10 Essential (primary) hypertension: Secondary | ICD-10-CM | POA: Diagnosis not present

## 2023-07-06 DIAGNOSIS — E875 Hyperkalemia: Secondary | ICD-10-CM

## 2023-07-06 MED ORDER — PROPRANOLOL HCL 20 MG PO TABS: 20.0000 mg | ORAL_TABLET | Freq: Three times a day (TID) | ORAL | 11 refills | Status: AC | PRN

## 2023-07-06 MED ORDER — THYROID 30 MG PO TABS
30.0000 mg | ORAL_TABLET | Freq: Every day | ORAL | 1 refills | Status: AC
Start: 2023-07-06 — End: ?

## 2023-07-06 NOTE — Progress Notes (Signed)
 Established Patient Office Visit  Subjective:  Patient ID: Kristina Peck, female    DOB: 1960-09-13  Age: 63 y.o. MRN: [id number]  Chief Complaint  Patient presents with   Follow-up    1 month follow up    Patient is here today for her 1 month follow up.  She has been feeling fairly well since last appointment.   She does have additional concerns to discuss today.  Labs are not due today.  She needs refills.   I have reviewed her active problem list, medication list, allergies, notes from last encounter, lab results for her appointment today.      No other concerns at this time.   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   MRI 06/01/10 stable well circumscribed left perineal mass adjacent to levator muscle 3.3 x 2.8 cm.   07/20/10 Xlap no tumor seen or palpated. Post-op SBO wound infection requiring wound VAC and antibiotics at [facility]   Thyroid  disease     Past Surgical History:  Procedure Laterality Date   ABDOMINAL HYSTERECTOMY     APPENDECTOMY     BREAST BIOPSY Left 11/03/2022   lt stereo calcs ribbon clip path pending   BREAST BIOPSY Left 11/03/2022   MM LT BREAST BX W LOC DEV 1ST LESION IMAGE BX SPEC STEREO GUIDE 11/03/2022 [facility]   BREAST REDUCTION SURGERY Bilateral 09/26/2020   Procedure: BILATERAL MAMMARY REDUCTION  (BREAST);  Surgeon: Elisabeth Craig RAMAN, MD;  Location: [facility];  Service: Plastics;  Laterality: Bilateral;  2 hours   COLON SURGERY     HEMATOMA EVACUATION Left 09/26/2020   Procedure:  EVACUATION HEMATOMA LEFT BREAST;  Surgeon: Elisabeth Craig RAMAN, MD;  Location:  SURGERY CENTER;  Service: Plastics;  Laterality: Left;   KNEE ARTHROSCOPY Right 07/10/2015   KNEE ARTHROSCOPY W/ MENISCAL REPAIR Right 05/13/2014   MEDIAL PARTIAL KNEE REPLACEMENT Right [id number]   REDUCTION MAMMAPLASTY     TUBAL LIGATION Bilateral 06/09/2009   Bilateral salphingo-oophorectomy    Social History   Socioeconomic History   Marital status: Married    Spouse name: Not on file   Number of children: 2   Years of education: Not on file   Highest education level: Not on file  Occupational History   Occupation: Film/video editor   Tobacco Use   Smoking status: Former    Current packs/day: 0.00    Average packs/day: 0.5 packs/day for 20.0 years (10.0 ttl pk-yrs)    Types: Cigarettes    Start date: 03/08/1988    Quit date: 03/08/2008    Years since quitting: 15.3   Smokeless tobacco: Never  Vaping Use   Vaping status: Never Used  Substance and Sexual Activity   Alcohol use: Yes    Alcohol/week: 0.0 standard drinks of alcohol    Comment: moderately   Drug use: No   Sexual activity: Yes    Partners: Male    Birth control/protection: Surgical  Other Topics  Concern   Not on file  Social History Narrative   Not on file   Social Drivers of Health   Financial Resource Strain: Not on file  Food Insecurity: Not on file  Transportation Needs: Not on file  Physical Activity: Not on file  Stress: Not on file  Social Connections: Not on file  Intimate Partner Violence: Not on file    Family History  Problem Relation Age of Onset   Diabetes Mother    Hypertension Mother    Hypertension Father    Aneurysm Father        aortic   Hypertension Sister    Heart attack Maternal Grandmother    Heart failure Maternal Grandfather    Heart attack Paternal Grandmother    Cancer Paternal Grandfather        not sure   Breast cancer Neg Hx     Allergies  Allergen Reactions   Lisinopril  Hives    Review of Systems  All other systems reviewed and are negative.      Objective:   BP (!) 150/98   Pulse 86   Ht 5' 6 (1.676 m)   Wt 248 lb 3.2 oz (112.6 kg)   SpO2 98%   BMI 40.06 kg/m   Vitals:   07/06/23 0927  BP: (!) 150/98  Pulse: 86  Height: 5' 6 (1.676 m)  Weight: 248 lb 3.2 oz (112.6 kg)  SpO2: 98%  BMI (Calculated): 40.08    Physical Exam Vitals and nursing note reviewed.  Constitutional:      Appearance: Normal appearance. She is normal weight.  HENT:     Head: Normocephalic.  Eyes:     Extraocular Movements: Extraocular movements intact.     Conjunctiva/sclera: Conjunctivae normal.     Pupils: Pupils are equal, round, and reactive to light.  Cardiovascular:     Rate and Rhythm: Normal rate.  Pulmonary:     Effort: Pulmonary effort is normal.  Neurological:     General: No focal deficit present.     Mental Status: She is alert and oriented to person, place, and time. Mental status is at baseline.  Psychiatric:        Mood and Affect: Mood normal.        Behavior: Behavior normal.        Thought Content: Thought content normal.        Judgment: Judgment normal.      No results found for any visits on 07/06/23.  Recent Results (from the past 2160 hours)  POCT Urinalysis Dipstick (18997)     Status: None   Collection Time: 05/30/23  4:00 PM  Result Value Ref Range   Color, UA Yellow    Clarity, UA Clear    Glucose, UA Negative Negative   Bilirubin, UA Negative    Ketones, UA Negative    Spec Grav, UA 1.015 1.010 - 1.025   Blood, UA Trace    pH, UA 6.0 5.0 - 8.0   Protein, UA Negative Negative   Urobilinogen, UA 0.2 0.2 or 1.0 E.U./dL   Nitrite, UA Negative    Leukocytes, UA Negative Negative   Appearance Clear    Odor No   Anti mullerian hormone     Status: None   Collection Time: 06/17/23  8:51 AM  Result Value Ref Range   ANTI-MULLERIAN HORMONE (AMH) <0.015 ng/mL    Comment: For assays employing antibodies, the  possibility exists for interference by heterophile antibodies in the samples.1  1.Kricka L.  Interferences in Immunoassays - still a threat.  Clin. Chem. 2000; 46: 8962-8961.  This test was developed and its performance characteristics determined by [facility]. It has not been cleared or approved by the Food and Drug Administration. Reference Range: Females 55 - 97y:  <=  0.18 Median  <0.03 Females at risk of ovarian hyperstimulation syndrome or polycystic ovarian syndrome (PCOS) may exhibit elevated serum AMH concentrations.   AMH levels from PCOS patients may be 2 to 5 fold higher than age-appropriate reference interval values. Granulosa cell tumors of the ovary may secrete AMH along with other tumor markers.  Elevated AMH is not specific for malignancy, and the assay should not be used exclusively to diagnose or exclude an AMH-secreting ovarian tumor.   FSH+Prog+E2+SHBG     Status: None   Collection Time: 06/17/23  8:51 AM  Result Value Ref Range   FSH 56.6 25.8 - 134.8 mIU/mL    Comment:                      Adult Female             Range                       Follicular phase      3.5 -  12.5                       Ovulation phase       4.7 -  21.5                       Luteal phase          1.7 -   7.7                       Postmenopausal       25.8 - 134.8    Progesterone 0.3 ng/mL    Comment:                      Follicular phase       0.1 -   0.9                      Luteal phase           1.8 -  23.9                      Ovulation phase        0.1 -  12.0                      Pregnant                         First trimester    11.0 -  44.3                         Second trimester   25.4 -  83.3                         Third trimester    58.7 - 214.0                      Postmenopausal  0.0 -   0.1    Sex Hormone Binding 72.7 17.3 - 125.0 nmol/L   Estradiol 31.6 0.0 - 54.7 pg/mL    Comment:                      Adult Female             Range                        Follicular phase     12.5 - 166.0                       Ovulation phase      85.8 - 498.0                       Luteal phase         43.8 - 211.0                       Postmenopausal       <6.0 -  54.7                      Pregnancy                       1st trimester     215.0 - >4300.0 Roche ECLIA methodology   Lipid panel     Status: Abnormal   Collection Time: 06/17/23  8:55 AM  Result Value Ref Range   Cholesterol, Total 197 100 - 199 mg/dL   Triglycerides 75 0 - 149 mg/dL   HDL 45 >60 mg/dL   VLDL Cholesterol Cal 14 5 - 40 mg/dL   LDL Chol Calc (NIH) 861 (H) 0 - 99 mg/dL   Chol/HDL Ratio 4.4 0.0 - 4.4 ratio    Comment:                                   T. Chol/HDL Ratio                                             Men  Women                               1/2 Avg.Risk  3.4    3.3                                   Avg.Risk  5.0    4.4                                2X Avg.Risk  9.6    7.1                                3X Avg.Risk 23.4   11.0   VITAMIN D  25 Hydroxy (Vit-D Deficiency, Fractures)     Status: Abnormal   Collection Time: 06/17/23  8:55 AM  Result Value Ref Range  Vit D, 25-Hydroxy 25.4 (L) 30.0 - 100.0 ng/mL    Comment: Vitamin D  deficiency has been defined by the Institute of Medicine and an Endocrine Society practice guideline as a level of serum 25-OH vitamin D  less than 20 ng/mL (1,2). The Endocrine Society went on to further define vitamin D  insufficiency as a level between 21 and 29 ng/mL (2). 1. IOM (Institute of Medicine). 2010. Dietary reference    intakes for calcium  and D. [place]: The    [employer]. 2. Holick MF, Binkley [place], Bischoff-Ferrari HA, et al.    Evaluation, treatment, and prevention of vitamin D     deficiency: an Endocrine Society clinical practice    guideline. JCEM. 2011 Jul; 96(7):1911-30.   CMP14+EGFR     Status: Abnormal   Collection Time: 06/17/23  8:55 AM  Result Value Ref Range   Glucose 86 70 - 99 mg/dL    BUN 9 8 - 27 mg/dL   Creatinine, Ser 9.13 0.57 - 1.00 mg/dL   eGFR 77 >40 fO/fpw/8.26   BUN/Creatinine Ratio 10 (L) 12 - 28   Sodium 138 134 - 144 mmol/L   Potassium 3.2 (L) 3.5 - 5.2 mmol/L   Chloride 96 96 - 106 mmol/L   CO2 24 20 - 29 mmol/L   Calcium  9.0 8.7 - 10.3 mg/dL   Total Protein 7.1 6.0 - 8.5 g/dL   Albumin 4.2 3.8 - 4.9 g/dL   Globulin, Total 2.9 1.5 - 4.5 g/dL   Bilirubin Total 0.4 0.0 - 1.2 mg/dL   Alkaline Phosphatase 111 44 - 121 IU/L   AST 16 0 - 40 IU/L   ALT 10 0 - 32 IU/L  TSH     Status: Abnormal   Collection Time: 06/17/23  8:55 AM  Result Value Ref Range   TSH 9.290 (H) 0.450 - 4.500 uIU/mL  Hemoglobin A1c     Status: None   Collection Time: 06/17/23  8:55 AM  Result Value Ref Range   Hgb A1c MFr Bld 5.2 4.8 - 5.6 %    Comment:          Prediabetes: 5.7 - 6.4          Diabetes: >6.4          Glycemic control for adults with diabetes: <7.0    Est. average glucose Bld gHb Est-mCnc 103 mg/dL  Vitamin B12     Status: None   Collection Time: 06/17/23  8:55 AM  Result Value Ref Range   Vitamin B-12 508 232 - 1,245 pg/mL  Iron, TIBC and Ferritin Panel     Status: Abnormal   Collection Time: 06/17/23  8:55 AM  Result Value Ref Range   Total Iron Binding Capacity 308 250 - 450 ug/dL   UIBC 758 868 - 574 ug/dL   Iron 67 27 - 840 ug/dL   Iron Saturation 22 15 - 55 %   Ferritin 215 (H) 15 - 150 ng/mL       Assessment & Plan Abnormal thyroid  function test Postprocedural hypothyroidism TSH was abnormal at last check.  Will recheck, along with T3 and T4.  Hyperkalemia Rechecking CMP.  Will call pt with results when available Hypertension, goal below 140/90 HTN still not well controlled.  Highly suspect this is due to her working environment.   I have asked pt to continue monitoring her BP carefully at home and to make sure she is taking her medications.     Return in about 1 month (around  08/05/2023) for F/U.   Total time spent: 20  minutes  ALAN CHRISTELLA ARRANT, FNP  07/06/2023   This document may have been prepared by [facility] Voice Recognition software and as such may include unintentional dictation errors.

## 2023-07-07 LAB — CMP14+EGFR
ALT: 9 IU/L (ref 0–32)
AST: 5 IU/L (ref 0–40)
Albumin: 4.4 g/dL (ref 3.8–4.9)
Alkaline Phosphatase: 129 IU/L — ABNORMAL HIGH (ref 44–121)
BUN/Creatinine Ratio: 10 — ABNORMAL LOW (ref 12–28)
BUN: 9 mg/dL (ref 8–27)
Bilirubin Total: 0.3 mg/dL (ref 0.0–1.2)
CO2: 28 mmol/L (ref 20–29)
Calcium: 9.6 mg/dL (ref 8.7–10.3)
Chloride: 95 mmol/L — ABNORMAL LOW (ref 96–106)
Creatinine, Ser: 0.92 mg/dL (ref 0.57–1.00)
Globulin, Total: 3.4 g/dL (ref 1.5–4.5)
Glucose: 87 mg/dL (ref 70–99)
Potassium: 3.6 mmol/L (ref 3.5–5.2)
Sodium: 137 mmol/L (ref 134–144)
Total Protein: 7.8 g/dL (ref 6.0–8.5)
eGFR: 71 mL/min/{1.73_m2} (ref >59)

## 2023-07-07 LAB — TSH+T4F+T3FREE
Free T4: 1.04 ng/dL (ref 0.82–1.77)
T3, Free: 3.4 pg/mL (ref 2.0–4.4)
TSH: 2.62 u[IU]/mL (ref 0.450–4.500)

## 2023-07-20 ENCOUNTER — Other Ambulatory Visit: Payer: Self-pay

## 2023-07-20 MED ORDER — ZEPBOUND 15 MG/0.5ML ~~LOC~~ SOAJ
15.0000 mg | SUBCUTANEOUS | 0 refills | Status: DC
  Filled 2023-07-20: qty 2, 28d supply, fill #0

## 2023-08-05 ENCOUNTER — Encounter: Payer: Self-pay | Admitting: Family

## 2023-08-05 ENCOUNTER — Ambulatory Visit: Admitting: Family

## 2023-08-05 VITALS — BP 144/94 | HR 81 | Ht 66.0 in | Wt 246.6 lb

## 2023-08-05 DIAGNOSIS — I10 Essential (primary) hypertension: Secondary | ICD-10-CM | POA: Diagnosis not present

## 2023-08-05 DIAGNOSIS — F419 Anxiety disorder, unspecified: Secondary | ICD-10-CM

## 2023-08-05 DIAGNOSIS — Z6841 Body Mass Index (BMI) 40.0 and over, adult: Secondary | ICD-10-CM

## 2023-08-05 DIAGNOSIS — F411 Generalized anxiety disorder: Secondary | ICD-10-CM | POA: Diagnosis not present

## 2023-08-05 MED ORDER — CLONIDINE HCL 0.2 MG PO TABS: 0.2000 mg | ORAL_TABLET | Freq: Two times a day (BID) | ORAL | 1 refills | Status: AC

## 2023-08-14 ENCOUNTER — Other Ambulatory Visit: Payer: Self-pay

## 2023-08-14 MED ORDER — ZEPBOUND 15 MG/0.5ML ~~LOC~~ SOAJ
SUBCUTANEOUS | 0 refills | Status: DC
  Filled 2023-08-14: qty 2, 28d supply, fill #0

## 2023-08-15 ENCOUNTER — Other Ambulatory Visit: Payer: Self-pay

## 2023-08-27 ENCOUNTER — Other Ambulatory Visit: Payer: Self-pay | Admitting: Family

## 2023-08-27 DIAGNOSIS — I10 Essential (primary) hypertension: Secondary | ICD-10-CM

## 2023-09-05 ENCOUNTER — Ambulatory Visit: Admitting: Family

## 2023-09-14 ENCOUNTER — Other Ambulatory Visit: Payer: Self-pay

## 2023-09-14 MED ORDER — ZEPBOUND 15 MG/0.5ML ~~LOC~~ SOAJ
15.0000 mg | SUBCUTANEOUS | 0 refills | Status: DC
  Filled 2023-09-14: qty 2, 28d supply, fill #0

## 2023-09-16 ENCOUNTER — Ambulatory Visit: Admitting: Family

## 2023-09-21 ENCOUNTER — Other Ambulatory Visit: Payer: Self-pay

## 2023-09-25 NOTE — Assessment & Plan Note (Signed)
 HTN still not well controlled.  Highly suspect this is due to her working environment.   I have asked pt to continue monitoring her BP carefully at home and to make sure she is taking her medications.

## 2023-09-25 NOTE — Assessment & Plan Note (Signed)
 TSH was abnormal at last check.  Will recheck, along with T3 and T4.

## 2023-09-26 ENCOUNTER — Other Ambulatory Visit: Payer: Self-pay

## 2023-09-27 ENCOUNTER — Other Ambulatory Visit: Payer: Self-pay

## 2023-09-27 ENCOUNTER — Other Ambulatory Visit: Payer: Self-pay | Admitting: Family

## 2023-10-03 ENCOUNTER — Encounter: Payer: Self-pay | Admitting: Family

## 2023-10-03 ENCOUNTER — Ambulatory Visit: Admitting: Family

## 2023-10-03 VITALS — BP 140/90 | HR 78 | Ht 66.0 in | Wt 241.8 lb

## 2023-10-03 DIAGNOSIS — I10 Essential (primary) hypertension: Secondary | ICD-10-CM

## 2023-10-03 DIAGNOSIS — F411 Generalized anxiety disorder: Secondary | ICD-10-CM | POA: Diagnosis not present

## 2023-10-03 DIAGNOSIS — T63441A Toxic effect of venom of bees, accidental (unintentional), initial encounter: Secondary | ICD-10-CM

## 2023-10-03 NOTE — Progress Notes (Signed)
 Established Patient Office Visit  Subjective:  Patient ID: Kristina Peck, female    DOB: [birth year]  Age: 63 y.o. MRN: [id number]  Chief Complaint  Patient presents with   Follow-up    1 month follow up    Stung on inside of her left thigh.   Has blood pressure still elevated.  Was told she could potentially have to go back into the office.     No other concerns at this time.   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   MRI 06/01/10 stable well circumscribed left perineal mass adjacent to levator muscle 3.3 x 2.8 cm.   07/20/10 Xlap no tumor seen or palpated. Post-op SBO wound infection requiring wound VAC and antibiotics at [facility]   Thyroid  disease     Past Surgical History:  Procedure Laterality Date   ABDOMINAL HYSTERECTOMY     APPENDECTOMY     BREAST BIOPSY Left 11/03/2022   lt stereo calcs ribbon clip path pending   BREAST BIOPSY Left 11/03/2022   MM LT BREAST BX W LOC DEV 1ST LESION IMAGE BX SPEC STEREO GUIDE 11/03/2022 [facility]   BREAST REDUCTION SURGERY Bilateral 09/26/2020   Procedure: BILATERAL MAMMARY REDUCTION  (BREAST);  Surgeon: Elisabeth Craig RAMAN, MD;  Location: [facility];  Service: Plastics;  Laterality: Bilateral;  2 hours   COLON SURGERY     HEMATOMA EVACUATION Left 09/26/2020   Procedure: EVACUATION HEMATOMA LEFT BREAST;  Surgeon: Elisabeth Craig RAMAN, MD;  Location: [facility];  Service: Plastics;  Laterality: Left;   KNEE ARTHROSCOPY Right 07/10/2015   KNEE ARTHROSCOPY W/ MENISCAL REPAIR  Right 05/13/2014   MEDIAL PARTIAL KNEE REPLACEMENT Right [id number]   REDUCTION MAMMAPLASTY     TUBAL LIGATION Bilateral 06/09/2009   Bilateral salphingo-oophorectomy    Social History   Socioeconomic History   Marital status: Married    Spouse name: Not on file   Number of children: 2   Years of education: Not on file   Highest education level: Not on file  Occupational History   Occupation: Film/video editor   Tobacco Use   Smoking status: Former    Current packs/day: 0.00    Average packs/day: 0.5 packs/day for 20.0 years (10.0 ttl pk-yrs)    Types: Cigarettes    Start date: 03/08/1988    Quit date: 03/08/2008    Years since quitting: 15.5   Smokeless tobacco: Never  Vaping Use   Vaping status: Never Used  Substance and Sexual Activity   Alcohol use: Yes    Alcohol/week: 0.0 standard drinks of alcohol    Comment: moderately   Drug use: No   Sexual activity: Yes    Partners: Male    Birth control/protection: Surgical  Other Topics Concern   Not on file  Social History Narrative   Not on file   Social Drivers of Health   Financial Resource Strain: Not on file  Food Insecurity: Not on file  Transportation Needs: Not on file  Physical Activity: Not on file  Stress: Not on file  Social Connections: Not on file  Intimate Partner Violence: Not on file    Family History  Problem Relation Age of Onset   Diabetes Mother    Hypertension Mother    Hypertension Father    Aneurysm Father        aortic   Hypertension Sister    Heart attack Maternal Grandmother    Heart failure Maternal Grandfather    Heart attack Paternal Grandmother    Cancer Paternal Grandfather        not sure   Breast cancer Neg Hx     Allergies  Allergen Reactions   Lisinopril Hives    Review of Systems  All other systems reviewed and are negative.      Objective:   BP (!) 140/90   Pulse 78   Ht 5' 6 (1.676 m)   Wt 241 lb 12.8 oz (109.7 kg)   SpO2 98%   BMI 39.03 kg/m    Vitals:   10/03/23 1530  BP: (!) 140/90  Pulse: 78  Height: 5' 6 (1.676 m)  Weight: 241 lb 12.8 oz (109.7 kg)  SpO2: 98%  BMI (Calculated): 39.05    Physical Exam Vitals and nursing note reviewed.  Constitutional:      Appearance: Normal appearance. She is normal weight.  HENT:     Head: Normocephalic.  Eyes:     Extraocular Movements: Extraocular movements intact.     Conjunctiva/sclera: Conjunctivae normal.     Pupils: Pupils are equal, round, and reactive to light.  Cardiovascular:     Rate and Rhythm: Normal rate.  Pulmonary:     Effort: Pulmonary effort is normal.  Neurological:     General: No focal deficit present.     Mental Status: She is alert and oriented to person, place, and time. Mental status is at baseline.  Psychiatric:        Mood and Affect: Mood normal.        Behavior: Behavior normal.        Thought Content: Thought content normal.      No results found for any visits on 10/03/23.  Recent Results (from the past 2160 hours)  TSH+T4F+T3Free     Status: None   Collection Time: 07/06/23  9:54 AM  Result Value Ref Range   TSH 2.620 0.450 - 4.500 uIU/mL   T3, Free 3.4 2.0 - 4.4 pg/mL   Free T4 1.04 0.82 - 1.77 ng/dL  [id number]+ZHQM     Status: Abnormal   Collection Time: 07/06/23  9:54 AM  Result Value Ref Range   Glucose 87 70 - 99 mg/dL   BUN 9 8 - 27 mg/dL   Creatinine, Ser 9.07 0.57 - 1.00 mg/dL   eGFR 71 >40 fO/fpw/8.26   BUN/Creatinine Ratio 10 (L) 12 - 28   Sodium 137 134 - 144 mmol/L   Potassium 3.6 3.5 - 5.2 mmol/L   Chloride 95 (L) 96 - 106 mmol/L   CO2 28 20 - 29 mmol/L   Calcium  9.6 8.7 - 10.3 mg/dL   Total Protein 7.8 6.0 - 8.5 g/dL   Albumin 4.4 3.8 - 4.9 g/dL   Globulin, Total 3.4 1.5 - 4.5 g/dL   Bilirubin Total 0.3 0.0 - 1.2 mg/dL   Alkaline Phosphatase 129 (H) 44 - 121 IU/L   AST 5 0 - 40 IU/L   ALT 9 0 - 32 IU/L  Assessment & Plan Hypertension, goal below 140/90 HTN improved today. Pt needs to remain at  home to manage her blood pressure effectively, as going in to her office is likely to increase her blood pressure and her anxiety further.  Will give patient letter stating that this is likely to be detrimental to her health.   Generalized anxiety disorder Pt needs to remain at home to manage her blood pressure effectively, as going in to her office is likely to increase her blood pressure and her anxiety further.  Will give patient letter stating that this is likely to be detrimental to her health.  Bee sting, accidental or unintentional, initial encounter Well managed, healing without signs/symptoms of infection.  Will let me know if it does not continue to improve.     Return in about 3 months (around 01/03/2024).   Total time spent: 20 minutes  ALAN CHRISTELLA ARRANT, FNP  10/03/2023   This document may have been prepared by [facility] Voice Recognition software and as such may include unintentional dictation errors.

## 2023-10-09 ENCOUNTER — Other Ambulatory Visit: Payer: Self-pay

## 2023-10-09 MED ORDER — ZEPBOUND 15 MG/0.5ML ~~LOC~~ SOAJ
15.0000 mg | SUBCUTANEOUS | 0 refills | Status: DC
  Filled 2023-10-09: qty 2, 28d supply, fill #0

## 2023-10-14 ENCOUNTER — Other Ambulatory Visit: Payer: Self-pay

## 2023-10-16 ENCOUNTER — Encounter: Payer: Self-pay | Admitting: Family

## 2023-10-16 NOTE — Assessment & Plan Note (Signed)
 Continue current meds.  Will adjust as needed based on results.  The patient is asked to make an attempt to improve diet and exercise patterns to aid in medical management of this problem. Addressed importance of increasing and maintaining water intake.

## 2023-10-16 NOTE — Assessment & Plan Note (Signed)
 Increasing dose on her blood pressure meds.  Continue current other therapy.  Will reassess at follow up.

## 2023-10-16 NOTE — Progress Notes (Signed)
 Established Patient Office Visit  Subjective:  Patient ID: Kristina Peck, female    DOB: [birth year]  Age: 63 y.o. MRN: [id number]  Chief Complaint  Patient presents with   Follow-up    1 month follow up    Patient is here today for her 1 month follow up.  She has been feeling fairly well since last appointment.   She does not have additional concerns to discuss today.  Labs are not due today.  She needs refills.   I have reviewed her active problem list, medication list, allergies, notes from last encounter, lab results for her appointment today.      No other concerns at this time.   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   MRI 06/01/10 stable well circumscribed left perineal mass adjacent to levator muscle 3.3 x 2.8 cm.   07/20/10 Xlap no tumor seen or palpated. Post-op SBO wound infection requiring wound VAC and antibiotics at [facility]   Thyroid  disease     Past Surgical History:  Procedure Laterality Date   ABDOMINAL HYSTERECTOMY     APPENDECTOMY     BREAST BIOPSY Left 11/03/2022   lt stereo calcs ribbon clip path pending   BREAST BIOPSY Left 11/03/2022   MM LT BREAST BX W LOC DEV 1ST LESION IMAGE BX SPEC STEREO GUIDE 11/03/2022 [facility]   BREAST REDUCTION SURGERY Bilateral 09/26/2020   Procedure: BILATERAL MAMMARY REDUCTION  (BREAST);  Surgeon: Elisabeth Craig RAMAN, MD;  Location: [facility];  Service: Plastics;  Laterality: Bilateral;  2 hours   COLON SURGERY     HEMATOMA EVACUATION Left 09/26/2020   Procedure:  EVACUATION HEMATOMA LEFT BREAST;  Surgeon: Elisabeth Craig RAMAN, MD;  Location: [facility];  Service: Plastics;  Laterality: Left;   KNEE ARTHROSCOPY Right 07/10/2015   KNEE ARTHROSCOPY W/ MENISCAL REPAIR Right 05/13/2014   MEDIAL PARTIAL KNEE REPLACEMENT Right [id number]   REDUCTION MAMMAPLASTY     TUBAL LIGATION Bilateral 06/09/2009   Bilateral salphingo-oophorectomy    Social History   Socioeconomic History   Marital status: Married    Spouse name: Not on file   Number of children: 2   Years of education: Not on file   Highest education level: Not on file  Occupational History   Occupation: Film/video editor   Tobacco Use   Smoking status: Former    Current packs/day: 0.00    Average packs/day: 0.5 packs/day for 20.0 years (10.0 ttl pk-yrs)    Types: Cigarettes    Start date: 03/08/1988    Quit date: 03/08/2008    Years since quitting: 15.6   Smokeless tobacco: Never  Vaping Use   Vaping status: Never Used  Substance and Sexual Activity   Alcohol use: Yes    Alcohol/week: 0.0 standard drinks of alcohol    Comment: moderately   Drug use: No   Sexual activity: Yes    Partners: Male    Birth control/protection: Surgical  Other  Topics Concern   Not on file  Social History Narrative   Not on file   Social Drivers of Health   Financial Resource Strain: Not on file  Food Insecurity: Not on file  Transportation Needs: Not on file  Physical Activity: Not on file  Stress: Not on file  Social Connections: Not on file  Intimate Partner Violence: Not on file    Family History  Problem Relation Age of Onset   Diabetes Mother    Hypertension Mother    Hypertension Father    Aneurysm Father        aortic   Hypertension Sister    Heart attack Maternal Grandmother    Heart failure Maternal Grandfather    Heart attack Paternal Grandmother    Cancer Paternal Grandfather        not sure   Breast cancer Neg Hx     Allergies  Allergen Reactions   Lisinopril  Hives    Review of Systems  All other systems reviewed and are negative.      Objective:   BP (!) 144/94   Pulse 81   Ht 5' 6 (1.676 m)   Wt 246 lb 9.6 oz (111.9 kg)   SpO2 98%   BMI 39.80 kg/m   Vitals:   08/05/23 1508  BP: (!) 144/94  Pulse: 81  Height: 5' 6 (1.676 m)  Weight: 246 lb 9.6 oz (111.9 kg)  SpO2: 98%  BMI (Calculated): 39.82    Physical Exam Vitals and nursing note reviewed.  Constitutional:      Appearance: Normal appearance. She is obese.  HENT:     Head: Normocephalic and atraumatic.  Eyes:     Extraocular Movements: Extraocular movements intact.     Conjunctiva/sclera: Conjunctivae normal.     Pupils: Pupils are equal, round, and reactive to light.  Cardiovascular:     Rate and Rhythm: Normal rate.  Pulmonary:     Effort: Pulmonary effort is normal.  Neurological:     General: No focal deficit present.     Mental Status: She is alert and oriented to person, place, and time. Mental status is at baseline.  Psychiatric:        Mood and Affect: Mood normal.        Behavior: Behavior normal.        Thought Content: Thought content normal.        Judgment: Judgment normal.      No results found for any visits on 08/05/23.  No results found for this or any previous visit (from the past 2160 hours).     Assessment & Plan Generalized anxiety disorder Patient stable.  Well controlled with current therapy.   Continue current meds.   BMI 40.0-44.9, adult (HCC) Continue current meds.  Will adjust as needed based on results.  The patient is asked to make an attempt to improve diet and exercise patterns to aid in medical management of this problem. Addressed importance of increasing and maintaining water intake.   Hypertension, goal below 140/90 Increasing dose on her blood pressure meds.  Continue current other therapy.  Will reassess at follow up.     Return in about 1 month (around 09/05/2023) for F/U.   Total time spent: 20  minutes  ALAN CHRISTELLA ARRANT, FNP  08/05/2023   This document may have been prepared by [facility] Voice Recognition software and as such may include unintentional dictation errors.

## 2023-10-16 NOTE — Assessment & Plan Note (Signed)
 Patient stable.  Well controlled with current therapy.   Continue current meds.

## 2023-11-07 ENCOUNTER — Encounter: Payer: Self-pay | Admitting: Family

## 2023-11-07 NOTE — Assessment & Plan Note (Signed)
 HTN improved today. Pt needs to remain at home to manage her blood pressure effectively, as going in to her office is likely to increase her blood pressure and her anxiety further.  Will give patient letter stating that this is likely to be detrimental to her health.

## 2023-11-07 NOTE — Assessment & Plan Note (Signed)
 Pt needs to remain at home to manage her blood pressure effectively, as going in to her office is likely to increase her blood pressure and her anxiety further.  Will give patient letter stating that this is likely to be detrimental to her health.

## 2023-11-09 ENCOUNTER — Other Ambulatory Visit: Payer: Self-pay

## 2023-11-09 MED ORDER — ZEPBOUND 15 MG/0.5ML ~~LOC~~ SOAJ
SUBCUTANEOUS | 0 refills | Status: DC
  Filled 2023-11-09: qty 2, 28d supply, fill #0

## 2023-11-15 ENCOUNTER — Other Ambulatory Visit: Payer: Self-pay

## 2023-11-15 ENCOUNTER — Telehealth: Payer: Self-pay

## 2023-11-15 DIAGNOSIS — I10 Essential (primary) hypertension: Secondary | ICD-10-CM

## 2023-11-15 NOTE — Telephone Encounter (Signed)
 Pt states she monitors her BP. 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

## 2023-11-16 MED ORDER — BUSPIRONE HCL 15 MG PO TABS: 15.0000 mg | ORAL_TABLET | Freq: Every day | ORAL | 1 refills | Status: DC

## 2023-11-16 MED ORDER — AMLODIPINE BESYLATE 5 MG PO TABS: 5.0000 mg | ORAL_TABLET | Freq: Every day | ORAL | 1 refills | Status: AC

## 2023-11-16 MED ORDER — LOSARTAN POTASSIUM 100 MG PO TABS: 100.0000 mg | ORAL_TABLET | Freq: Every morning | ORAL | 1 refills | Status: DC

## 2023-11-20 ENCOUNTER — Other Ambulatory Visit: Payer: Self-pay | Admitting: Family

## 2023-11-21 ENCOUNTER — Encounter: Payer: Self-pay | Admitting: Family

## 2023-11-21 NOTE — Telephone Encounter (Signed)
 Patient informed.

## 2023-11-30 ENCOUNTER — Encounter: Payer: Self-pay | Admitting: Family

## 2023-12-01 NOTE — Telephone Encounter (Signed)
 Printed and given to PCP to get filled out

## 2023-12-06 ENCOUNTER — Other Ambulatory Visit: Payer: Self-pay

## 2023-12-06 MED ORDER — ZEPBOUND 15 MG/0.5ML ~~LOC~~ SOAJ
15.0000 mg | SUBCUTANEOUS | 0 refills | Status: DC
  Filled 2023-12-06: qty 2, 28d supply, fill #0

## 2024-01-08 ENCOUNTER — Other Ambulatory Visit: Payer: Self-pay

## 2024-01-08 MED ORDER — ZEPBOUND 15 MG/0.5ML ~~LOC~~ SOAJ
15.0000 mg | SUBCUTANEOUS | 0 refills | Status: DC
  Filled 2024-01-08: qty 2, 28d supply, fill #0

## 2024-01-18 ENCOUNTER — Other Ambulatory Visit: Payer: Self-pay | Admitting: Family

## 2024-01-18 DIAGNOSIS — Z1231 Encounter for screening mammogram for malignant neoplasm of breast: Secondary | ICD-10-CM

## 2024-01-22 ENCOUNTER — Other Ambulatory Visit: Payer: Self-pay | Admitting: Family

## 2024-01-31 ENCOUNTER — Ambulatory Visit
Admission: RE | Admit: 2024-01-31 | Discharge: 2024-01-31 | Disposition: A | Discharge status: Home / Self Care | Source: Ambulatory Visit | Attending: Family | Admitting: Family

## 2024-01-31 DIAGNOSIS — Z1231 Encounter for screening mammogram for malignant neoplasm of breast: Secondary | ICD-10-CM | POA: Insufficient documentation

## 2024-02-04 ENCOUNTER — Other Ambulatory Visit: Payer: Self-pay

## 2024-02-04 MED ORDER — ZEPBOUND 15 MG/0.5ML ~~LOC~~ SOAJ
15.0000 mg | SUBCUTANEOUS | 0 refills | Status: DC
  Filled 2024-02-04: qty 2, 28d supply, fill #0

## 2024-03-07 ENCOUNTER — Other Ambulatory Visit: Payer: Self-pay

## 2024-03-07 MED ORDER — ZEPBOUND 15 MG/0.5ML ~~LOC~~ SOAJ
15.0000 mg | SUBCUTANEOUS | 0 refills | Status: DC
  Filled 2024-03-07: qty 2, 28d supply, fill #0

## 2024-03-09 ENCOUNTER — Other Ambulatory Visit: Payer: Self-pay

## 2024-04-05 ENCOUNTER — Other Ambulatory Visit: Payer: Self-pay

## 2024-04-05 MED ORDER — ZEPBOUND 15 MG/0.5ML ~~LOC~~ SOAJ
SUBCUTANEOUS | 0 refills | Status: AC
  Filled 2024-04-05: qty 2, 28d supply, fill #0

## 2024-04-25 ENCOUNTER — Ambulatory Visit: Admitting: Family
